# Patient Record
Sex: Female | Born: 1990 | Race: White | Hispanic: No | Marital: Single | Smoking: Never smoker
Health system: Southern US, Community
[De-identification: ages and names within clinical notes are randomized; demographics above are authoritative.]

## PROBLEM LIST (undated history)

## (undated) DIAGNOSIS — F419 Anxiety disorder, unspecified: Secondary | ICD-10-CM

## (undated) HISTORY — DX: Anxiety disorder, unspecified: F41.9

---
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
## 2011-01-28 NOTE — Assessment & Plan Note (Signed)
Summary: college cpx ok per doc/njr   Vital Signs:  Patient profile:   20 year old female Weight:      114 pounds BMI:     19.95 BP sitting:   102 / 68  (left arm) Cuff size:   regular  Vitals Entered By: Raechel Ache, RN (June 23, 2010 3:35 PM) CC: College CPX, c/o L knee pain.   History of Present Illness: 20 yr old female for a college exam and also to discuss left knee pain. She will be attending [employer] this fall, and has some forms to fill out. She feels good in general, but about 2 months ago she fell down going up some steps. She felt her left knee give way, causing her to fall. Ever since then the knee has been mildly swollen, it hurts to walk or run on it, and she cannot bend it all the way. No more giving way or locking. She saw an extender at [employer] a few weeks ago, and he ordered some PT for her. This has not helped at all. Plain Xrays were normal.   Allergies (verified): No Known Drug Allergies  Past History:  Past Medical History: migraines sees Rocco Pauls   Past Surgical History: arthroscopy right knee 05-08-2009 per Dr. Thomasena Edis for torn meniscus  Family History: Reviewed history from 08/28/2008 and no changes required. unremarkable  Social History: Reviewed history from 08/28/2008 and no changes required. Lives with parents Care taker verifies today that the child's current immunizations are up to date.   Review of Systems  The patient denies anorexia, fever, weight loss, weight gain, vision loss, decreased hearing, hoarseness, chest pain, syncope, dyspnea on exertion, peripheral edema, prolonged cough, headaches, hemoptysis, abdominal pain, melena, hematochezia, severe indigestion/heartburn, hematuria, incontinence, genital sores, muscle weakness, suspicious skin lesions, transient blindness, difficulty walking, depression, unusual weight change, abnormal bleeding, enlarged lymph nodes, angioedema, breast masses, and  testicular masses.    Physical Exam  General:  Well-developed,well-nourished,in no acute distress; alert,appropriate and cooperative throughout examination Head:  Normocephalic and atraumatic without obvious abnormalities. No apparent alopecia or balding. Eyes:  No corneal or conjunctival inflammation noted. EOMI. Perrla. Funduscopic exam benign, without hemorrhages, exudates or papilledema. Vision grossly normal. Ears:  External ear exam shows no significant lesions or deformities.  Otoscopic examination reveals clear canals, tympanic membranes are intact bilaterally without bulging, retraction, inflammation or discharge. Hearing is grossly normal bilaterally. Nose:  External nasal examination shows no deformity or inflammation. Nasal mucosa are pink and moist without lesions or exudates. Mouth:  Oral mucosa and oropharynx without lesions or exudates.  Teeth in good repair. Neck:  No deformities, masses, or tenderness noted. Chest Wall:  No deformities, masses, or tenderness noted. Lungs:  Normal respiratory effort, chest expands symmetrically. Lungs are clear to auscultation, no crackles or wheezes. Heart:  Normal rate and regular rhythm. S1 and S2 normal without gallop, murmur, click, rub or other extra sounds. Abdomen:  Bowel sounds positive,abdomen soft and non-tender without masses, organomegaly or hernias noted. Msk:  No deformity or scoliosis noted of thoracic or lumbar spine.   Pulses:  R and L carotid,radial,femoral,dorsalis pedis and posterior tibial pulses are full and equal bilaterally Extremities:  normal except the left knee. Ths is mildly swollen. She cannot fully flex or extend the knee due to pain. She is not tender at all. Anterior drawer and McMurrays are negative.  Neurologic:  No cranial nerve deficits noted. Station and gait are normal. Plantar reflexes are down-going  bilaterally. DTRs are symmetrical throughout. Sensory, motor and coordinative functions appear intact. Skin:   Intact without suspicious lesions or rashes Cervical Nodes:  No lymphadenopathy noted Axillary Nodes:  No palpable lymphadenopathy Inguinal Nodes:  No significant adenopathy Psych:  Cognition and judgment appear intact. Alert and cooperative with normal attention span and concentration. No apparent delusions, illusions, hallucinations   Impression & Recommendations:  Problem # 1:  HEALTH MAINTENANCE EXAM (ICD-V70.0)  Problem # 2:  KNEE PAIN (ICD-719.46)  Complete Medication List: 1)  Implanon 68 Mg Impl (Etonogestrel) .... Left arm, birth control 2)  Zomig Zmt 5 Mg Tbdp (Zolmitriptan) .... One by mouth at onset on migraine and may repeat once in 2 hours with max of 2 in 24 hours.  Other Orders: Tdap => 18yrs IM (21308) Menactra IM (65784) Admin 1st Vaccine (69629) Admin of Any Addtl Vaccine (52841)  Patient Instructions: 1)  She was given a Menactra and a Tdap today.  2)  Advised her to see Dr. Thomasena Edis ASAP about her left knee, as she may have torn some cartilage in it.    Immunization History:  Hepatitis B Immunization History:    Hepatitis B # 1:  hepb nb-36yrs ([birth year])    Hepatitis B # 2:  hepb nb-86yrs (06/02/1992)    Hepatitis B # 3:  hepb nb-20yrs (10/30/1992)  DPT Immunization History:    DPT # 1:  dpt (06/27/1992)    DPT # 2:  dpt (08/25/1992)    DPT # 3:  dpt (10/30/1992)    DPT # 4:  dpt (08/21/1993)  HIB Immunization History:    HIB # 1:  hib (06/27/1992)    HIB # 2:  hib (08/25/1992)    HIB # 3:  hib (10/30/1992)    HIB # 4:  hib (08/21/1993)  Polio Immunization History:    Polio # 1:  historical (06/27/1992)    Polio # 2:  historical (08/25/1992)    Polio # 3:  historical (01/23/1993)  MMR Immunization History:    MMR # 1:  mmr (08/21/1993)  Immunizations Administered:  Tetanus Vaccine:    Vaccine Type: Tdap    Site: left deltoid    Mfr: GlaxoSmithKline    Dose: 0.5 ml    Route: IM    Given by: Raechel Ache, RN    Exp. Date:  03/19/2012    Lot #: [id number]    VIS given: 11/14/07 version given June 23, 2010.  Meningococcal Vaccine:    Vaccine Type: Menactra    Site: right deltoid    Mfr: Sanofi Pasteur    Dose: 0.5 ml    Route: IM    Given by: Raechel Ache, RN    Exp. Date: 01/14/2012    Lot #: [id number]    VIS given: 01/23/07 version given June 23, 2010.

## 2011-01-28 NOTE — Progress Notes (Signed)
Summary: Pt req to get a school cpx done asap  Phone Note Call from Patient Call back at [phone] cell   Caller: Patient Summary of Call: Pt called and is needing to get a school cpx done asap. Pls advise.      Initial call taken by: Lucy Antigua,  July 16, 2010 1:58 PM  Follow-up for Phone Call        I not sure what she needs, we already did a college cpx on 06-23-10 Follow-up by: Nelwyn Salisbury MD,  July 17, 2010 10:39 AM  Additional Follow-up for Phone Call Additional follow up Details #1::        I called and lft vm for pt to return my call. Additional Follow-up by: Lucy Antigua,  July 17, 2010 12:57 PM    Additional Follow-up for Phone Call Additional follow up Details #2::    Pt is aware that she came in for cpx in June, but she said that she needs Dr. Clent Ridges to complete a physical form. Pt would like to drop form by the office, and she will pick up when completed. Due date on form in August. If pt needs an ov to have form completed, please advise.    Follow-up by: Lucy Antigua,  July 17, 2010 3:08 PM  Additional Follow-up for Phone Call Additional follow up Details #3:: Details for Additional Follow-up Action Taken: she can just drop the form off and I will fill it out for her, no OV needed Additional Follow-up by: Nelwyn Salisbury MD,  July 17, 2010 4:27 PM

## 2011-01-28 NOTE — Progress Notes (Signed)
Summary: requesting cpx asap  Phone Note Call from Patient Call back at [phone]   Caller: Mom---live call Reason for Call: Talk to Doctor Summary of Call: requesting a collge cpx with some shots before 06-26-2010. Please advise. Initial call taken by: Warnell Forester,  June 18, 2010 8:53 AM  Follow-up for Phone Call        anytime next week would be fine Follow-up by: Nelwyn Salisbury MD,  June 18, 2010 11:15 AM  Additional Follow-up for Phone Call Additional follow up Details #1::        cpx 06-23-2010 330pm Additional Follow-up by: Heron Sabins,  June 18, 2010 2:28 PM

## 2011-01-28 NOTE — Letter (Signed)
Summary: Immunization/Shot Record  [facility]  [address] [place], [place] [address]   Phone: [phone]  Fax: [phone]     Immunization Record for: Mckenzie Kelley Bos  Vaccine 1 2 3 4 5 6  HepB Hepatitis B [birth year] 1992      06/02/1992      10/30/1992          DTP Diphtheria, Tetanus, Pertussis 06/27/1992      08/25/1992      10/30/1992      08/21/1993          HIB Haemophilus influenzae Type b 06/27/1992     08/25/1992     10/30/1992     08/21/1993     ZHYQMVHQIO IPV Inactivated Poliovirus 06/27/1992    08/25/1992    01/23/1993       MMR Measles, Mumps, Rubella 08/21/1993 06/24/2010  Marguerite Olea NGEXBMWUXL KGMWNUUVOZ Varicella Varivax    DGUYQIHKVQ QVZDGLOVFI EPPIRJJOAC Pneumococcal           Hep A Hepatitis A   ZYSAYTKZSW FUXNATFTDD UKGURKYHCW CBJSEGBTDV        Tetanus Booster Date of Last: Tdap 06/23/2010  Flu Shot Date of Last:  Pneumovax Date of Last:  Meningococcal Vaccine Given: Menactra 06/23/2010     Other Vaccines HPV Vaccine/ Date of Last:    Vaccine/ Date of Last:    Vaccine/ Date of Last:     Marguerite Olea  VOHYWVPXTG  GYIRSWNIOE Rotavirus Vaccine/ Date of Last:    Vaccine/ Date of Last:    Vaccine/ Date of Last:     VOJJKKXFGH  [facility]  WEXHBZJIRC Zostavax Vaccine/ Date of Last:     Marguerite Olea  VELFYBOFBP  Marguerite Olea  ZWCHENIDPO  EUMPNTIRWE  Recommended Childhood and Adolescent Immunization Schedule [place]  2006 Vaccine Age Birth 1 mos 2 mos 4 mos 6 mos 12 mos 15 mos 18 mos 24 mos 4-6 yrs 11-12 yrs 13-14 yrs 15 yrs 16-18 yrs Hepatitis B1 HepB HepB HepB1  HepB  HepB Series Catch-Up Diphtheria, Tetanus, Pertussis2   DTaP DTaP DTaP   DTaP  DTaP Tdap  Tdap Catch-Up Haemophilus influenzae type b3   Hib Hib Hib3 Hib        Inactivated  Poliovirus   IPV IPV  IPV   IPV     Measles, Mumps, Rubella4      MMR   MMR M MR MMR Catch-Up Varicella5       Varicella  Varicella Catch-Up Meningo-coccal6           MCV4  MCV4 CatchUpV4           MPSV4 for High Risk Groups  C MCV4 for High Risk Groups Pneumo-coccal7   PCV PCV PCV PCV   PCV  Catch-Up PPV for High Risk Groups         PPV for High Risk Groups  Influenza8      Influenza (Yearly)  Influenza (Yearly) for High Risk Groups Hepatitis A9       HepA Series  This schedule indicates the recommended ages for routine administration of currently licensed childhood vaccines, as of November 26, 2004, for children through age 75 years. Any dose not administered at the recommended age should be administered at any subsequent visit when indicated and feasible. Indicates age groups that warrant special effort to administer those vaccines not previously administered. Additional vaccines may be licensed and recommended during the year. Licensed combination vaccines may be used whenever any components of the combination are indicated and other components  of the vaccine are not contraindicated and if approved by the Food and Drug Administration for that dose of the series. Providers should consult the respective ACIP statement for detailed recommendations. Clinically significant adverse events that follow immunization should be reported to the Vaccine Adverse Event Reporting System (VAERS). Guidance about how to obtain and complete a VAERS form is available at www.vaers.LAgents.no or by telephone, 4107679043.  The Childhood and Adolescent Immunization Schedule is approved by: Advisory Committee on Administrator http://www.wade.com/   American Academy of Pediatrics BridgeDigest.com.cy   American Academy [employer] American.aafp.org

## 2011-01-28 NOTE — Letter (Signed)
Summary: Physical Examination for College  Physical Examination for College   Imported By: Maryln Gottron 07/27/2010 11:09:10  _____________________________________________________________________  External Attachment:    Type:   Image     Comment:   External Document

## 2011-01-28 NOTE — Assessment & Plan Note (Signed)
Summary: MMR  Nurse Visit   Allergies: No Known Drug Allergies  Immunizations Administered:  MMR Vaccine # 2:    Vaccine Type: MMR    Site: right deltoid    Mfr: Merck    Dose: 0.5 ml    Given by: Raechel Ache, RN    Exp. Date: 09/24/2011    Lot #: [id number]    VIS given: 03/09/07 version given June 24, 2010.  Orders Added: 1)  MMR Vaccine SQ [90707] 2)  Admin 1st Vaccine [57846]

## 2011-01-28 NOTE — Assessment & Plan Note (Signed)
Summary: H/A, EPISTAXIS // RS   Vital Signs:  Patient profile:   20 year old female Temp:     98 degrees F oral BP sitting:   130 / 72  (left arm) Cuff size:   regular  Vitals Entered By: Sid Falcon LPN (June 01, 1609 2:34 PM) CC: headaches, epistaxis   History of Present Illness: Headaches, recently progressive in frequency.  3- 4 year hx of headaches.  Throbbing headache bifrontal and sometimes unilateral.  Photophobia and worse with activity.  Some nausea without vomiting.  Helps to get in dark room and sleep. Ibuprofen helps sometimes but not recently.  Headaches are moderate to severe at times.  No focal neurologic symptoms.  Duration usually several hours.  Fa hx of migraine headaches.  One episode of nose bleed.   No aura.  No clear triggers.  Headaches do not correlate with menses.  Moderate caffeine consumption.  Physical Exam  General:  well developed, well nourished, in no acute distress Head:  normocephalic and atraumatic Eyes:  PERRLA/EOM intact; symetric corneal light reflex and red reflex; normal cover-uncover test Ears:  TMs intact and clear with normal canals and hearing Mouth:  no deformity or lesions and dentition appropriate for age Neck:  no masses, thyromegaly, or abnormal cervical nodes Lungs:  clear bilaterally to A & P Heart:  RRR without murmur Neurologic:  no focal deficits, CN II-XII grossly intact with normal reflexes, coordination, muscle strength and tone Skin:  no rash. Cervical Nodes:  no significant adenopathy   Allergies (verified): No Known Drug Allergies  Past History:  Past Medical History: Last updated: 08/28/2008 Unremarkable  Review of Systems  The patient denies anorexia, fever, weight loss, vision loss, decreased hearing, syncope, difficulty walking, and depression.     Impression & Recommendations:  Problem # 1:  MIGRAINE HEADACHE (ICD-346.90) Assessment New  Clinical features which suggest migraine include  unilateral (sometimes), photophobia, throbbing quality, nausea, and improvement with sleep.  Discussed possible triggers.  Sample of Zomig ZMT given 5 mg with instruction for use and rx if this helps.  F/U with Dr Clent Ridges if headaches persist. Her updated medication list for this problem includes:    Zomig Zmt 5 Mg Tbdp (Zolmitriptan) ..... One by mouth at onset on migraine and may repeat once in 2 hours with max of 2 in 24 hours.  Orders: Est. Patient Level IV (96045)  Medications Added to Medication List This Visit: 1)  Implanon 68 Mg Impl (Etonogestrel) .... Left arm, birth control 2)  Zomig Zmt 5 Mg Tbdp (Zolmitriptan) .... One by mouth at onset on migraine and may repeat once in 2 hours with max of 2 in 24 hours.  Patient Instructions: 1)  With next headache try Zomig one at onset and may repeat once in 2 hours as needed. Prescriptions: ZOMIG ZMT 5 MG TBDP (ZOLMITRIPTAN) one by mouth at onset on migraine and may repeat once in 2 hours with max of 2 in 24 hours.  #6 x 6   Entered and Authorized by:   Evelena Peat MD   Signed by:   Evelena Peat MD on 06/01/2010   Method used:   Print then Give to Patient   RxID:   [id number]

## 2011-03-30 LAB — COMPREHENSIVE METABOLIC PANEL
ALT: 10 U/L (ref 0–35)
AST: 19 U/L (ref 0–37)
Albumin: 4 g/dL (ref 3.5–5.2)
Alkaline Phosphatase: 71 U/L (ref 47–119)
BUN: 9 mg/dL (ref 6–23)
CO2: 24 mEq/L (ref 19–32)
Calcium: 9.3 mg/dL (ref 8.4–10.5)
Chloride: 107 mEq/L (ref 96–112)
Creatinine, Ser: 0.82 mg/dL (ref 0.4–1.2)
Glucose, Bld: 102 mg/dL — ABNORMAL HIGH (ref 70–99)
Potassium: 3.8 mEq/L (ref 3.5–5.1)
Sodium: 139 mEq/L (ref 135–145)
Total Bilirubin: 0.5 mg/dL (ref 0.3–1.2)
Total Protein: 6.7 g/dL (ref 6.0–8.3)

## 2011-03-30 LAB — URINALYSIS, ROUTINE W REFLEX MICROSCOPIC
Bilirubin Urine: NEGATIVE
Glucose, UA: NEGATIVE mg/dL
Hgb urine dipstick: NEGATIVE
Ketones, ur: NEGATIVE mg/dL
Nitrite: NEGATIVE
Protein, ur: NEGATIVE mg/dL
Specific Gravity, Urine: 1.01 (ref 1.005–1.030)
Urobilinogen, UA: 1 mg/dL (ref 0.0–1.0)
pH: 7 (ref 5.0–8.0)

## 2011-03-30 LAB — DIFFERENTIAL
Basophils Absolute: 0.1 10*3/uL (ref 0.0–0.1)
Basophils Relative: 1 % (ref 0–1)
Lymphocytes Relative: 45 % (ref 24–48)
Neutro Abs: 3.3 10*3/uL (ref 1.7–8.0)

## 2011-03-30 LAB — CBC
HCT: 36 % (ref 36.0–49.0)
Hemoglobin: 12.3 g/dL (ref 12.0–16.0)
MCHC: 34.3 g/dL (ref 31.0–37.0)
MCV: 88.1 fL (ref 78.0–98.0)
Platelets: 260 10*3/uL (ref 150–400)
RBC: 4.08 MIL/uL (ref 3.80–5.70)
RDW: 12.5 % (ref 11.4–15.5)
WBC: 7.3 10*3/uL (ref 4.5–13.5)

## 2011-03-30 LAB — LIPASE, BLOOD: Lipase: 25 U/L (ref 11–59)

## 2011-03-30 LAB — POCT PREGNANCY, URINE: Preg Test, Ur: NEGATIVE

## 2011-07-19 ENCOUNTER — Ambulatory Visit: Payer: Self-pay | Admitting: Family Medicine

## 2011-07-19 DIAGNOSIS — Z0000 Encounter for general adult medical examination without abnormal findings: Secondary | ICD-10-CM

## 2011-07-19 DIAGNOSIS — Z111 Encounter for screening for respiratory tuberculosis: Secondary | ICD-10-CM

## 2011-07-22 LAB — TB SKIN TEST: Induration: 0

## 2011-09-22 LAB — POCT URINALYSIS DIP (DEVICE)
Bilirubin Urine: NEGATIVE
Nitrite: NEGATIVE
Specific Gravity, Urine: <=1.005
Urobilinogen, UA: 0.2
pH: 7

## 2011-09-22 LAB — POCT PREGNANCY, URINE: Preg Test, Ur: NEGATIVE

## 2011-12-06 ENCOUNTER — Ambulatory Visit (INDEPENDENT_AMBULATORY_CARE_PROVIDER_SITE_OTHER): Payer: Self-pay | Admitting: Family Medicine

## 2011-12-06 ENCOUNTER — Encounter: Payer: Self-pay | Admitting: Family Medicine

## 2011-12-06 VITALS — BP 108/60 | HR 88 | Temp 98.1°F | Wt 127.0 lb

## 2011-12-06 DIAGNOSIS — F411 Generalized anxiety disorder: Secondary | ICD-10-CM

## 2011-12-06 DIAGNOSIS — F419 Anxiety disorder, unspecified: Secondary | ICD-10-CM

## 2011-12-06 MED ORDER — CITALOPRAM HYDROBROMIDE 10 MG PO TABS: 10.0000 mg | ORAL_TABLET | Freq: Every day | ORAL | Status: DC

## 2011-12-06 NOTE — Progress Notes (Signed)
  Subjective:    Patient ID: Mckenzie Kelley, female    DOB: Jul 18, 1991, 20 y.o.   MRN: [id number]  HPI Here to discuss some anxiety issues she has been dealing with lately. She describes herself as having been anxious all her life and as being a perfectionist. When it comes to school she always worries about things that she has to do in the future and about things she has already done. She often cannot sleep but worries about things all night. 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 She denies any depression.    Review of Systems  Constitutional: Negative.   Psychiatric/Behavioral: Negative for behavioral problems, confusion, dysphoric mood, decreased concentration and agitation. The patient is nervous/anxious.        Objective:   Physical Exam  Constitutional: She appears well-developed and well-nourished.  Psychiatric: She has a normal mood and affect. Her behavior is normal. Judgment and thought content normal.          Assessment & Plan:  Mild anxiety with some obsessive compulsive qualities. Try celexa. Recheck in 2-3 weeks

## 2011-12-31 ENCOUNTER — Ambulatory Visit (INDEPENDENT_AMBULATORY_CARE_PROVIDER_SITE_OTHER): Payer: Self-pay | Admitting: Family Medicine

## 2011-12-31 ENCOUNTER — Encounter: Payer: Self-pay | Admitting: Family Medicine

## 2011-12-31 VITALS — BP 100/68 | HR 73 | Temp 98.1°F | Wt 125.0 lb

## 2011-12-31 DIAGNOSIS — M26609 Unspecified temporomandibular joint disorder, unspecified side: Secondary | ICD-10-CM

## 2011-12-31 DIAGNOSIS — F411 Generalized anxiety disorder: Secondary | ICD-10-CM

## 2011-12-31 DIAGNOSIS — F419 Anxiety disorder, unspecified: Secondary | ICD-10-CM

## 2011-12-31 MED ORDER — CITALOPRAM HYDROBROMIDE 20 MG PO TABS: 20.0000 mg | ORAL_TABLET | Freq: Every day | ORAL | Status: DC

## 2011-12-31 NOTE — Progress Notes (Signed)
  Subjective:    Patient ID: Mckenzie Kelley, female    DOB: January 25, 1991, 21 y.o.   MRN: [id number]  HPI Here to recheck on her anxiety after taking Celexa for a few weeks. She has had no side effects but she has not noticed much of a difference either way. She has been on break from classes so her stress levels have been lower then usual anyway. Also she describes intermittent pains in the right jaw for the past few months. Sometimes her jaw pops and it is hard to open her mouth or chew. Advil helps. Today it feels better. She admits to clenching her mouth a lot when stressed.    Review of Systems  Constitutional: Negative.   HENT: Negative.   Psychiatric/Behavioral: Negative for dysphoric mood, decreased concentration and agitation. The patient is nervous/anxious.        Objective:   Physical Exam  Constitutional: She appears well-developed and well-nourished.  HENT:       The right TMJ is tender but there is no crepitus. Full ROM   Psychiatric: She has a normal mood and affect. Her behavior is normal. Thought content normal.          Assessment & Plan:  We will increase the Celexa to 20 mg a day. 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 Use 800 mg of Advil prn. She will see her dentist to get a night time mouth guard made.

## 2012-02-29 ENCOUNTER — Telehealth: Payer: Self-pay | Admitting: *Deleted

## 2012-02-29 NOTE — Telephone Encounter (Signed)
Both children have been exposed to vomiting, diarrhea, low grade fever and would like to have RX called to [facility].   All the children had been exposed to Dr. Claris Che children.

## 2012-03-01 NOTE — Telephone Encounter (Signed)
This message was in error. We spoke to [place] and she is not actually sick. She feels fine. Her sister, Adelina Mings, was sick.

## 2012-07-26 ENCOUNTER — Ambulatory Visit (INDEPENDENT_AMBULATORY_CARE_PROVIDER_SITE_OTHER): Payer: 59 | Admitting: Family Medicine

## 2012-07-26 ENCOUNTER — Encounter: Payer: Self-pay | Admitting: Family Medicine

## 2012-07-26 VITALS — BP 100/64 | HR 60 | Temp 98.5°F | Wt 124.0 lb

## 2012-07-26 DIAGNOSIS — H6090 Unspecified otitis externa, unspecified ear: Secondary | ICD-10-CM

## 2012-07-26 DIAGNOSIS — H60399 Other infective otitis externa, unspecified ear: Secondary | ICD-10-CM

## 2012-07-26 MED ORDER — NEOMYCIN-POLYMYXIN-HC 3.5-10000-1 OT SOLN: 3.0000 [drp] | Freq: Four times a day (QID) | OTIC | Status: AC

## 2012-07-26 NOTE — Progress Notes (Signed)
  Subjective:    Patient ID: Mckenzie Kelley, female    DOB: Jan 30, 1991, 21 y.o.   MRN: [id number]  HPI Here for 4 days of right ear pain. No other symptoms. She spent a week at the beach last week.    Review of Systems  Constitutional: Negative.   HENT: Positive for ear pain. Negative for hearing loss, congestion, postnasal drip, sinus pressure and ear discharge.   Eyes: Negative.   Respiratory: Negative.        Objective:   Physical Exam  Constitutional: She appears well-developed and well-nourished.  HENT:  Left Ear: External ear normal.  Nose: Nose normal.  Mouth/Throat: Oropharynx is clear and moist.       Right ear canal is red and swollen, the TM is clear   Eyes: Conjunctivae are normal.  Lymphadenopathy:    She has no cervical adenopathy.          Assessment & Plan:  Use Advil prn. Keep water out of the ear until this has resolved

## 2012-12-11 ENCOUNTER — Ambulatory Visit (INDEPENDENT_AMBULATORY_CARE_PROVIDER_SITE_OTHER): Payer: 59 | Admitting: Family Medicine

## 2012-12-11 ENCOUNTER — Encounter: Payer: Self-pay | Admitting: Family Medicine

## 2012-12-11 VITALS — BP 108/70 | HR 64 | Temp 98.0°F | Wt 126.0 lb

## 2012-12-11 DIAGNOSIS — B081 Molluscum contagiosum: Secondary | ICD-10-CM

## 2012-12-11 NOTE — Progress Notes (Signed)
  Subjective:    Patient ID: Mckenzie Kelley, female    DOB: 04/21/1991, 21 y.o.   MRN: [id number]  HPI Here while on her winter break from college to look an itchy rash that started on the right neck about one month ago. It has since spread to the upper chest and the back. She saw [facility] 2 weeks ago and was given a course of Doxycycline. This has not helped. She has no other symptoms.    Review of Systems  Constitutional: Negative.   Skin: Positive for rash.       Objective:   Physical Exam  Constitutional: She appears well-developed and well-nourished. No distress.  Skin:       Wide spread rash consisting of individual tiny papules on the chest and back. Some are umbilicated, many are not. No erythema, no pustules or vesicles           Assessment & Plan:  Molluscum contagiosum. We will refer her to Dermatology. She will avoid any direct skin contact with others.

## 2013-03-14 ENCOUNTER — Encounter: Payer: Self-pay | Admitting: Family Medicine

## 2013-03-14 ENCOUNTER — Ambulatory Visit (INDEPENDENT_AMBULATORY_CARE_PROVIDER_SITE_OTHER): Payer: BC Managed Care – PPO | Admitting: Family Medicine

## 2013-03-14 VITALS — BP 110/64 | HR 65 | Temp 98.6°F | Wt 130.0 lb

## 2013-03-14 DIAGNOSIS — F411 Generalized anxiety disorder: Secondary | ICD-10-CM

## 2013-03-14 MED ORDER — FLUOXETINE HCL 20 MG PO TABS: 20.0000 mg | ORAL_TABLET | Freq: Every day | ORAL | Status: DC

## 2013-03-14 NOTE — Progress Notes (Signed)
  Subjective:    Patient ID: Mckenzie Kelley, female    DOB: 09-Mar-1991, 21 y.o.   MRN: [id number]  HPI Here for her anxiety. 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 However her stress levels are high and and she feels very anxious. She has had a couple of episodes of over whelming anxiety which sound almost like panic attacks. She has been seeing a therapist weekly and this helps. She had taken Celexa for a while until she stopped thi last year since it did not seem to help her. She sleeps well.    Review of Systems  Constitutional: Negative.   Psychiatric/Behavioral: Positive for decreased concentration. Negative for hallucinations, confusion, dysphoric mood and agitation. The patient is nervous/anxious.        Objective:   Physical Exam  Constitutional: She is oriented to person, place, and time. She appears well-developed and well-nourished.  Neurological: She is alert and oriented to person, place, and time.  Psychiatric: She has a normal mood and affect. Her behavior is normal. Thought content normal.          Assessment & Plan:  We will start on Prozac at 20 mg daily. Recheck in 2-3 weeks

## 2014-07-30 ENCOUNTER — Ambulatory Visit (INDEPENDENT_AMBULATORY_CARE_PROVIDER_SITE_OTHER): Payer: BC Managed Care – PPO | Admitting: Family Medicine

## 2014-07-30 DIAGNOSIS — Z111 Encounter for screening for respiratory tuberculosis: Secondary | ICD-10-CM

## 2014-08-02 LAB — TB SKIN TEST
INDURATION: 0 mm
TB SKIN TEST: NEGATIVE

## 2014-08-12 ENCOUNTER — Ambulatory Visit (INDEPENDENT_AMBULATORY_CARE_PROVIDER_SITE_OTHER): Payer: BC Managed Care – PPO | Admitting: Physician Assistant

## 2014-08-12 ENCOUNTER — Encounter: Payer: Self-pay | Admitting: Physician Assistant

## 2014-08-12 ENCOUNTER — Ambulatory Visit: Payer: BC Managed Care – PPO | Admitting: Physician Assistant

## 2014-08-12 VITALS — BP 108/68 | HR 66 | Temp 98.3°F | Resp 18 | Ht 63.0 in | Wt 128.0 lb

## 2014-08-12 DIAGNOSIS — L259 Unspecified contact dermatitis, unspecified cause: Secondary | ICD-10-CM

## 2014-08-12 DIAGNOSIS — Z0000 Encounter for general adult medical examination without abnormal findings: Secondary | ICD-10-CM

## 2014-08-12 LAB — CBC WITH DIFFERENTIAL/PLATELET
BASOS ABS: 0 10*3/uL (ref 0.0–0.1)
Basophils Relative: 0.4 % (ref 0.0–3.0)
EOS ABS: 0.1 10*3/uL (ref 0.0–0.7)
Eosinophils Relative: 1.1 % (ref 0.0–5.0)
HCT: 37.8 % (ref 36.0–46.0)
Hemoglobin: 13.1 g/dL (ref 12.0–15.0)
LYMPHS PCT: 29.5 % (ref 12.0–46.0)
Lymphs Abs: 2.1 10*3/uL (ref 0.7–4.0)
MCHC: 34.5 g/dL (ref 30.0–36.0)
MCV: 89 fl (ref 78.0–100.0)
MONOS PCT: 7.6 % (ref 3.0–12.0)
Monocytes Absolute: 0.5 10*3/uL (ref 0.1–1.0)
NEUTROS PCT: 61.4 % (ref 43.0–77.0)
Neutro Abs: 4.4 10*3/uL (ref 1.4–7.7)
Platelets: 265 10*3/uL (ref 150.0–400.0)
RBC: 4.25 Mil/uL (ref 3.87–5.11)
RDW: 12.9 % (ref 11.5–15.5)
WBC: 7.1 10*3/uL (ref 4.0–10.5)

## 2014-08-12 LAB — BASIC METABOLIC PANEL
BUN: 11 mg/dL (ref 6–23)
CHLORIDE: 107 meq/L (ref 96–112)
CO2: 27 mEq/L (ref 19–32)
Calcium: 9.3 mg/dL (ref 8.4–10.5)
Creatinine, Ser: 0.9 mg/dL (ref 0.4–1.2)
GFR: 88.66 mL/min (ref >60.00)
Glucose, Bld: 73 mg/dL (ref 70–99)
POTASSIUM: 4 meq/L (ref 3.5–5.1)
Sodium: 143 mEq/L (ref 135–145)

## 2014-08-12 LAB — POCT URINALYSIS DIPSTICK
Bilirubin, UA: NEGATIVE
Glucose, UA: NEGATIVE
Ketones, UA: NEGATIVE
LEUKOCYTES UA: NEGATIVE
Nitrite, UA: NEGATIVE
PROTEIN UA: NEGATIVE
SPEC GRAV UA: 1.02
UROBILINOGEN UA: 0.2
pH, UA: 5.5

## 2014-08-12 LAB — HEPATIC FUNCTION PANEL
ALBUMIN: 4.1 g/dL (ref 3.5–5.2)
ALT: 12 U/L (ref 0–35)
AST: 19 U/L (ref 0–37)
Alkaline Phosphatase: 67 U/L (ref 39–117)
Bilirubin, Direct: 0.1 mg/dL (ref 0.0–0.3)
TOTAL PROTEIN: 7.2 g/dL (ref 6.0–8.3)
Total Bilirubin: 0.5 mg/dL (ref 0.2–1.2)

## 2014-08-12 LAB — LIPID PANEL
CHOLESTEROL: 138 mg/dL (ref 0–200)
HDL: 54.2 mg/dL (ref >39.00)
LDL CALC: 62 mg/dL (ref 0–99)
NonHDL: 83.8
TRIGLYCERIDES: 107 mg/dL (ref 0.0–149.0)
Total CHOL/HDL Ratio: 3
VLDL: 21.4 mg/dL (ref 0.0–40.0)

## 2014-08-12 NOTE — Progress Notes (Signed)
Subjective:    Patient ID: Mckenzie Kelley, female    DOB: Jun 10, 1991, 23 y.o.   MRN: [id number]  HPI Patient presents to clinic today for annual exam.  Acute Concerns: Rash- noticed yesterday. Doesn't appear to be improving or worsening. On both forearms. There is small red bumps. Very itchy. No swelling or ttp. Patient believes that she may have gotten this from a new detergent that she started. She has not tried anything for this. She denies fevers, chills, nausea, vomiting, diarrhea, shortness of breath, chest pain, headache, syncope.  Chronic Issues: None.  Health Maintenance: Dental -- Dr. Dian Kelley, once or twice per year. Vision -- None visual concerns. Immunizations -- UTD. Colonoscopy -- No family history of colon cancer. PAP -- Within the last year. LMP- currently on period.     Review of Systems Patient denies chest pain, shortness of breath, orthopnea. Denies lower extremity edema, abdominal pain, change in appetite, change in bowel movements. Patient denies rashes, musculoskeletal complaints. No other specific complaints in a complete review of systems.     Past Medical History  Diagnosis Date  . Migraine   . Anxiety     History   Social History  . Marital Status: Single    Spouse Name: N/A    Number of Children: N/A  . Years of Education: N/A   Occupational History  . Not on file.   Social History Main Topics  . Smoking status: Never Smoker   . Smokeless tobacco: Never Used  . Alcohol Use: No  . Drug Use: No  . Sexual Activity: Not on file   Other Topics Concern  . Not on file   Social History Narrative  . No narrative on file    Past Surgical History  Procedure Laterality Date  . Knee arthroscopy  05/08/09    Dr. Thomasena Edis for torn meniscus    No family history on file.  No Known Allergies  Current Outpatient Prescriptions on File Prior to Visit  Medication Sig Dispense Refill  . FLUoxetine (PROZAC) 20 MG tablet Take 1 tablet (20 mg  total) by mouth daily.  90 tablet  3   No current facility-administered medications on file prior to visit.    The PFS history was reviewed with the pt at time of visit.  EXAM: BP 108/68  Pulse 66  Temp(Src) 98.3 F (36.8 C) (Oral)  Resp 18  Ht 5\' 3"  (1.6 m)  Wt 128 lb (58.06 kg)  BMI 22.68 kg/m2  LMP 08/06/2014     Objective:   Physical Exam  Nursing note and vitals reviewed. Constitutional: She is oriented to person, place, and time. She appears well-developed and well-nourished. No distress.  HENT:  Head: Normocephalic and atraumatic.  Right Ear: External ear normal.  Left Ear: External ear normal.  Nose: Nose normal.  Mouth/Throat: Oropharynx is clear and moist. No oropharyngeal exudate.  Bilat TMs normal.  Eyes: Conjunctivae and EOM are normal. Pupils are equal, round, and reactive to light. Right eye exhibits no discharge. Left eye exhibits no discharge. No scleral icterus.  Neck: Normal range of motion. Neck supple. No JVD present. No tracheal deviation present. No thyromegaly present.  Cardiovascular: Normal rate, regular rhythm, normal heart sounds and intact distal pulses.  Exam reveals no gallop and no friction rub.   No murmur heard. Pulmonary/Chest: Effort normal and breath sounds normal. No stridor. No respiratory distress. She has no wheezes. She has no rales. She exhibits no tenderness.  Abdominal: Soft. Bowel sounds  are normal. She exhibits no distension and no mass. There is no tenderness. There is no rebound and no guarding.  Genitourinary:  Breast and GYN - deferred to OBGYN.  Musculoskeletal: Normal range of motion. She exhibits no edema and no tenderness.  Lymphadenopathy:    She has no cervical adenopathy.  Neurological: She is alert and oriented to person, place, and time. She has normal reflexes. No cranial nerve deficit. She exhibits normal muscle tone. Coordination normal.  Skin: Skin is warm and dry. Rash noted. She is not diaphoretic. No  erythema. No pallor.  Limited area of papular rash on bilateral forearms proximal to the elbows. This is non-erythematous, no swelling, no tenderness to palpation, no fluctuance.  Psychiatric: She has a normal mood and affect. Her behavior is normal. Judgment and thought content normal.     Lab Results  Component Value Date   WBC 7.3 12/02/2009   HGB 12.3 12/02/2009   HCT 36.0 12/02/2009   PLT 260 12/02/2009   GLUCOSE 102* 12/02/2009   ALT 10 12/02/2009   AST 19 12/02/2009   NA 139 12/02/2009   K 3.8 12/02/2009   CL 107 12/02/2009   CREATININE 0.82 12/02/2009   BUN 9 12/02/2009   CO2 24 12/02/2009        Assessment & Plan:  Mckenzie Kelley was seen today for no specified reason.  Diagnoses and associated orders for this visit:  Annual physical exam Comments: Over all healthy. Focus on healthy, varied diet, and regular exercise. - CBC with Differential - Basic Metabolic Panel - Lipid Panel - Hepatic Function Panel - POC Urinalysis Dipstick  Contact dermatitis Comments: Eliminate the cause from environment. Use topical hydrocortisone cream and cool compresses for treatment. Watchful waiting.    Return precautions provided, and patient handout on contact dermatitis.  Plan to follow up as needed, or for worsening or persistent symptoms despite treatment.  Patient Instructions  Try to find the causative agent for your rash and eliminate it from your environment, otherwise it will come back.  You can use topical hydrocortisone cream over the rash to help reduce itching symptoms. You can also use a cold compress for the itching.  Healthy varied diet, and regular exercise.  Keep your follow up with OBGYN.  If emergency symptoms discussed during visit developed, seek medical attention immediately.  Followup as needed, or for worsening or persistent symptoms despite treatment.

## 2014-08-12 NOTE — Progress Notes (Signed)
Pre visit review using our clinic review tool, if applicable. No additional management support is needed unless otherwise documented below in the visit note. 

## 2014-08-12 NOTE — Patient Instructions (Addendum)
Try to find the causative agent for your rash and eliminate it from your environment, otherwise it will come back.  You can use topical hydrocortisone cream over the rash to help reduce itching symptoms. You can also use a cold compress for the itching.  Healthy varied diet, and regular exercise.  Keep your follow up with OBGYN.  If emergency symptoms discussed during visit developed, seek medical attention immediately.  Followup as needed, or for worsening or persistent symptoms despite treatment.     Contact Dermatitis Contact dermatitis is a rash that happens when something touches the skin. You touched something that irritates your skin, or you have allergies to something you touched. HOME CARE   Avoid the thing that caused your rash.  Keep your rash away from hot water, soap, sunlight, chemicals, and other things that might bother it.  Do not scratch your rash.  You can take cool baths to help stop itching.  Only take medicine as told by your doctor.  Keep all doctor visits as told. GET HELP RIGHT AWAY IF:   Your rash is not better after 3 days.  Your rash gets worse.  Your rash is puffy (swollen), tender, red, sore, or warm.  You have problems with your medicine. MAKE SURE YOU:   Understand these instructions.  Will watch your condition.  Will get help right away if you are not doing well or get worse. Document Released: 10/10/2009 Document Revised: 03/06/2012 Document Reviewed: 05/18/2011 [facility] Patient Information 2015 [employer] [place]. This information is not intended to replace advice given to you by your health care provider. Make sure you discuss any questions you have with your health care provider.

## 2014-11-25 ENCOUNTER — Telehealth: Payer: Self-pay | Admitting: Family Medicine

## 2014-11-25 NOTE — Telephone Encounter (Signed)
noted 

## 2014-11-25 NOTE — Telephone Encounter (Signed)
Patient Information:  Caller Name: [name]  Phone: [phone]  Patient: Mckenzie Kelley, Mckenzie Kelley  Gender: Female  DOB: 07/07/1991  Age: 23 Years  PCP: Gershon CraneFry, [facility])  Pregnant: No  Office Follow Up:  Does the office need to follow up with this patient?: No  Instructions For The Office: N/A  RN Note:  Patient calling regarding mild pimple like fine rash noted on sides of face, neck and chest.  Denies blisters, pain or itching.  In addition she has had 2 menses this month.  LMP 11/10/14 normal cycle at normal time, then again she started on 11/22/14.  Describes this menses as mild and painless.  Currently not sexually active, and states "There is just no way I could be pregnant."  Encouraged to monitor menses and notify office in increase in bleeding.  Keep a chart of cycles and call office if cycles become irregular. Patient agrees with plan.  Symptoms  Reason For Call & Symptoms: rash on sides of face, neck, and chest  Reviewed Health History In EMR: Yes  Reviewed Medications In EMR: Yes  Reviewed Allergies In EMR: Yes  Reviewed Surgeries / Procedures: Yes  Date of Onset of Symptoms: 11/23/2014 OB / GYN:  LMP: 11/22/2014  Guideline(s) Used:  Rash or Redness - Localized  Disposition Per Guideline:   Home Care  Reason For Disposition Reached:   Mild localized rash  Advice Given:  Avoid the Cause:   Try to find the cause. Consider irritants like a plant (e.g., poison ivy or evergreens), chemicals (e.g., solvents or insecticides), fiberglass, a new cosmetic, or new jewelry (called contact dermatitis). A pet may be carrying the irritating substance (e.g., with poison ivy or poison oak).  Avoid Soap:  Wash the area once thoroughly with soap to remove any remaining irritants. Thereafter avoid soaps to this area. Cleanse the area when needed with warm water.  Hydrocortisone Cream for Itching:   Keep the cream in the refrigerator (Reason: it feels better if applied cold).  Available over-the-counter in [place] as 0.5% and 1% cream.  Call Back If:  Rash spreads or becomes worse  Rash lasts longer than 1 week  You become worse.  Expected Course:  Most of these rashes pass in 2 to 3 days.  Patient Will Follow Care Advice:  YES

## 2015-06-04 ENCOUNTER — Ambulatory Visit (INDEPENDENT_AMBULATORY_CARE_PROVIDER_SITE_OTHER): Payer: 59 | Admitting: Family Medicine

## 2015-06-04 ENCOUNTER — Encounter: Payer: Self-pay | Admitting: Family Medicine

## 2015-06-04 VITALS — BP 100/60 | HR 63 | Temp 97.9°F | Wt 131.0 lb

## 2015-06-04 DIAGNOSIS — F411 Generalized anxiety disorder: Secondary | ICD-10-CM | POA: Diagnosis not present

## 2015-06-04 MED ORDER — ESCITALOPRAM OXALATE 10 MG PO TABS: 10.0000 mg | ORAL_TABLET | Freq: Every day | ORAL | Status: DC

## 2015-06-04 NOTE — Progress Notes (Signed)
Pre visit review using our clinic review tool, if applicable. No additional management support is needed unless otherwise documented below in the visit note. 

## 2015-06-04 NOTE — Progress Notes (Signed)
   Subjective:    Patient ID: Mckenzie Kelley, female    DOB: 11/14/1991, 24 y.o.   MRN: [id number]  HPI Here to discuss anxiety. 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 She will be taking some pre-requisite courses so she can eventually apply to PA school. She has trouble relaxing, she worries about things, and she has a short temper. She denies any sadness or depression. She sleeps well. She tried Celexa briefly a few years ago but did not really give it a good try.    Review of Systems  Constitutional: Negative.   Neurological: Negative.   Psychiatric/Behavioral: Negative for suicidal ideas, hallucinations, behavioral problems, confusion, sleep disturbance, self-injury, dysphoric mood, decreased concentration and agitation. The patient is nervous/anxious. The patient is not hyperactive.        Objective:   Physical Exam  Constitutional: She is oriented to person, place, and time. She appears well-developed and well-nourished.  Neurological: She is alert and oriented to person, place, and time.  Psychiatric: She has a normal mood and affect. Her behavior is normal. Thought content normal.          Assessment & Plan:  Anxiety, treat with Lexapro 10 mg daily. Get regular exercise. She will follow up in one month. We spent 45 minutes face to face discussing these issues.

## 2015-06-27 ENCOUNTER — Telehealth: Payer: Self-pay | Admitting: Family Medicine

## 2015-06-27 NOTE — Telephone Encounter (Signed)
Try taking 1/2 of a tablet daily and take this at bedtime

## 2015-06-27 NOTE — Telephone Encounter (Signed)
Patient called stating escitalopram (LEXAPRO) 10 MG tablet is causing nausea. She would like to know what to do?

## 2015-06-27 NOTE — Telephone Encounter (Signed)
I spoke with pt  

## 2015-11-27 ENCOUNTER — Encounter: Payer: Self-pay | Admitting: Family Medicine

## 2015-11-27 ENCOUNTER — Ambulatory Visit (INDEPENDENT_AMBULATORY_CARE_PROVIDER_SITE_OTHER): Payer: Commercial Managed Care - PPO | Admitting: Family Medicine

## 2015-11-27 VITALS — BP 100/74 | HR 79 | Temp 98.4°F | Ht 63.0 in | Wt 129.0 lb

## 2015-11-27 DIAGNOSIS — J069 Acute upper respiratory infection, unspecified: Secondary | ICD-10-CM | POA: Diagnosis not present

## 2015-11-27 NOTE — Progress Notes (Signed)
   Subjective:    Patient ID: Mckenzie Kelley, female    DOB: 01/26/1991, 24 y.o.   MRN: [id number]  HPI Here for 3 days of ST, PND, and a dry cough. No NVD. She is taking Mucinex and drinking fluids.    Review of Systems  Constitutional: Negative.   HENT: Positive for congestion, postnasal drip and sore throat. Negative for ear pain and sinus pressure.   Eyes: Negative.   Respiratory: Positive for cough.        Objective:   Physical Exam  Constitutional: She appears well-developed and well-nourished. No distress.  HENT:  Right Ear: External ear normal.  Left Ear: External ear normal.  Nose: Nose normal.  Mouth/Throat: Oropharynx is clear and moist.  Eyes: Conjunctivae are normal.  Neck: No thyromegaly present.  Pulmonary/Chest: Effort normal and breath sounds normal.  Lymphadenopathy:    She has no cervical adenopathy.          Assessment & Plan:  Viral URI. Rest, take Advil prn. Written out of work today and tomorrow.

## 2015-11-27 NOTE — Progress Notes (Signed)
Pre visit review using our clinic review tool, if applicable. No additional management support is needed unless otherwise documented below in the visit note. 

## 2017-01-03 ENCOUNTER — Ambulatory Visit: Payer: Commercial Managed Care - PPO | Admitting: Family Medicine

## 2017-01-07 ENCOUNTER — Ambulatory Visit (INDEPENDENT_AMBULATORY_CARE_PROVIDER_SITE_OTHER): Payer: Commercial Managed Care - PPO | Admitting: Family Medicine

## 2017-01-07 ENCOUNTER — Encounter: Payer: Self-pay | Admitting: Family Medicine

## 2017-01-07 VITALS — BP 127/84 | HR 63 | Temp 98.0°F | Ht 63.0 in | Wt 126.0 lb

## 2017-01-07 DIAGNOSIS — F411 Generalized anxiety disorder: Secondary | ICD-10-CM

## 2017-01-07 MED ORDER — SERTRALINE HCL 25 MG PO TABS: 25.0000 mg | ORAL_TABLET | Freq: Every day | ORAL | 2 refills | Status: DC

## 2017-01-07 NOTE — Progress Notes (Signed)
Pre visit review using our clinic review tool, if applicable. No additional management support is needed unless otherwise documented below in the visit note. 

## 2017-01-07 NOTE — Progress Notes (Signed)
   Subjective:    Patient ID: Mckenzie Kelley, female    DOB: 12/23/1991, 26 y.o.   MRN: [id number]  HPI Here to ask about treatment for anxiety. She took Lexapro 5 mg 2 years ago and had mild results but the full 10 mg dose caused nausea. She stopped it and had done well for a year. Then 2 months ago her mother and sister had serious medical problems, and her anxiety came back. Now her relatives are doing better but she still feels anxious and she feels her jaws clenching a lot during the day. It is hard to relax and she has trouble sleeping at night.    Review of Systems  Constitutional: Negative.   Respiratory: Negative.   Cardiovascular: Negative.   Neurological: Negative.   Psychiatric/Behavioral: Positive for sleep disturbance. Negative for agitation, behavioral problems, confusion, decreased concentration, dysphoric mood and hallucinations. The patient is nervous/anxious.        Objective:   Physical Exam  Constitutional: She is oriented to person, place, and time. She appears well-developed and well-nourished.  Cardiovascular: Normal rate, regular rhythm, normal heart sounds and intact distal pulses.   Pulmonary/Chest: Effort normal and breath sounds normal.  Neurological: She is alert and oriented to person, place, and time.  Psychiatric: She has a normal mood and affect. Her behavior is normal. Thought content normal.          Assessment & Plan:  Anxiety, this time we will treat with Zoloft, beginning with a low dose of 25 mg daily. I advised her to take it with food. Recheck in 3-4 weeks.  Mckenzie CraneStephen Sylvain Hasten, MD

## 2017-02-14 ENCOUNTER — Telehealth: Payer: Self-pay | Admitting: Family Medicine

## 2017-02-14 NOTE — Telephone Encounter (Signed)
Increase the dose to 50 mg daily. She can double up on the 25's she has or we can call in 50 mg to take once daily, #30 with 2 rf

## 2017-02-14 NOTE — Telephone Encounter (Signed)
° ° °  Pt call to say she still chopping down on her jaw and is asking if she need to increase the below med     sertraline (ZOLOFT) 25 MG tablet

## 2017-02-15 ENCOUNTER — Encounter: Payer: Self-pay | Admitting: Family Medicine

## 2017-02-15 MED ORDER — SERTRALINE HCL 50 MG PO TABS: 50.0000 mg | ORAL_TABLET | Freq: Every day | ORAL | 3 refills | Status: DC

## 2017-02-15 NOTE — Telephone Encounter (Signed)
Spoke with pt and increased her Zoloft to 50 mg daily New Rx sent to [facility]

## 2017-02-18 NOTE — Telephone Encounter (Signed)
Increase the Zoloft to 50 mg daily. Call in #30 with 5 rf

## 2017-09-15 ENCOUNTER — Encounter: Payer: Self-pay | Admitting: Family Medicine

## 2018-09-05 ENCOUNTER — Telehealth: Payer: Commercial Managed Care - PPO | Admitting: Family

## 2018-09-05 DIAGNOSIS — R300 Dysuria: Secondary | ICD-10-CM

## 2018-09-05 NOTE — Progress Notes (Signed)
Based on what you shared with me it looks like you have a serious condition that should be evaluated in a face to face office visit.  NOTE: If you entered your credit card information for this eVisit, you will not be charged. You may see a "hold" on your card for the $30 but that hold will drop off and you will not have a charge processed.  If you are having a true medical emergency please call 911.  If you need an urgent face to face visit, [facility] has four urgent care centers for your convenience.  If you need care fast and have a high deductible or no insurance consider:   [email or web address] to reserve your spot online an avoid wait times  [facility] [address], [address] [place], [place] [address] 8 am to 8 pm Monday-Friday 10 am to 4 pm Saturday-Sunday *Across the street from [employer]  [facility]  [address] [place], [address] 8 am to 5 pm Monday-Friday * In the [facility]   The following sites will take your  insurance:  . [facility]  [phone] Get Driving Directions Find a Provider at this Location  [address] [place], [place] [address] . 10 am to 8 pm Monday-Friday . 12 pm to 8 pm Saturday-Sunday   . [facility]  [phone] Get Driving Directions Find a Provider at this Location  [address], [address] [place], [place] [address] . 8 am to 8 pm Monday-Friday . 9 am to 6 pm Saturday . 11 am to 6 pm Sunday   . [facility] [place]  [phone] Get Driving Directions  [address] [place],  [address] . 8 am to 8 pm Monday-Friday . 8 am to 4 pm Saturday-Sunday   Your e-visit answers were reviewed by a board certified advanced clinical practitioner to complete your personal care plan.  Thank you for using e-Visits.  

## 2018-09-08 ENCOUNTER — Ambulatory Visit: Payer: Commercial Managed Care - PPO | Admitting: Family Medicine

## 2018-09-08 ENCOUNTER — Ambulatory Visit: Payer: Managed Care, Other (non HMO) | Admitting: Family Medicine

## 2018-09-08 ENCOUNTER — Encounter: Payer: Self-pay | Admitting: Family Medicine

## 2018-09-08 VITALS — BP 120/70 | HR 78 | Temp 98.1°F | Ht 63.0 in | Wt 130.0 lb

## 2018-09-08 DIAGNOSIS — N390 Urinary tract infection, site not specified: Secondary | ICD-10-CM

## 2018-09-08 DIAGNOSIS — R350 Frequency of micturition: Secondary | ICD-10-CM

## 2018-09-08 LAB — POCT URINALYSIS DIPSTICK
BILIRUBIN UA: NEGATIVE
Glucose, UA: NEGATIVE
KETONES UA: NEGATIVE
Leukocytes, UA: NEGATIVE
NITRITE UA: POSITIVE
PH UA: 6 (ref 5.0–8.0)
Protein, UA: POSITIVE — AB
RBC UA: NEGATIVE
Spec Grav, UA: >=1.03 — AB (ref 1.010–1.025)
UROBILINOGEN UA: 0.2 U/dL

## 2018-09-08 MED ORDER — SERTRALINE HCL 50 MG PO TABS: 50.0000 mg | ORAL_TABLET | Freq: Every day | ORAL | 3 refills | Status: DC

## 2018-09-08 MED ORDER — CIPROFLOXACIN HCL 500 MG PO TABS: 500.0000 mg | ORAL_TABLET | Freq: Two times a day (BID) | ORAL | 0 refills | Status: DC

## 2018-09-08 NOTE — Progress Notes (Signed)
   Subjective:    Patient ID: Ledora BottcherPaige M Iglehart, female    DOB: 06/30/1991, 27 y.o.   MRN: [id number]  HPI Here for 3 months of intermittent burning on urination. No back pain or fever. She has used AZO off and on.    Review of Systems  Constitutional: Negative.   Respiratory: Negative.   Cardiovascular: Negative.   Gastrointestinal: Negative.   Genitourinary: Positive for dysuria, frequency and urgency. Negative for hematuria.       Objective:   Physical Exam  Constitutional: She appears well-developed and well-nourished.  Cardiovascular: Normal rate, regular rhythm, normal heart sounds and intact distal pulses.  Pulmonary/Chest: Effort normal and breath sounds normal.  Abdominal: Soft. Bowel sounds are normal. She exhibits no distension and no mass. There is no tenderness. There is no rebound and no guarding.          Assessment & Plan:  UTI, treat with Cipro. The sample is sent for a culture. She will drink plenty of water.  Gershon CraneStephen Fleurette Woolbright, MD

## 2018-09-10 LAB — URINE CULTURE
MICRO NUMBER:: 91100349
SPECIMEN QUALITY: ADEQUATE

## 2018-09-11 ENCOUNTER — Encounter: Payer: Self-pay | Admitting: *Deleted

## 2018-12-16 ENCOUNTER — Other Ambulatory Visit: Payer: Self-pay | Admitting: Family Medicine

## 2018-12-16 ENCOUNTER — Telehealth: Payer: Managed Care, Other (non HMO) | Admitting: Physician Assistant

## 2018-12-16 DIAGNOSIS — R300 Dysuria: Secondary | ICD-10-CM

## 2018-12-16 MED ORDER — CEPHALEXIN 500 MG PO CAPS: 500.0000 mg | ORAL_CAPSULE | Freq: Two times a day (BID) | ORAL | 0 refills | Status: AC

## 2018-12-16 NOTE — Progress Notes (Signed)
Further questions sent to patient for answer.

## 2018-12-16 NOTE — Progress Notes (Signed)

## 2019-01-04 ENCOUNTER — Encounter: Payer: Self-pay | Admitting: Family Medicine

## 2019-01-04 NOTE — Telephone Encounter (Signed)
Dr Fry please advise. thanks 

## 2019-01-05 ENCOUNTER — Telehealth: Payer: Managed Care, Other (non HMO) | Admitting: Family

## 2019-01-05 DIAGNOSIS — J208 Acute bronchitis due to other specified organisms: Secondary | ICD-10-CM | POA: Diagnosis not present

## 2019-01-05 DIAGNOSIS — B9689 Other specified bacterial agents as the cause of diseases classified elsewhere: Secondary | ICD-10-CM | POA: Diagnosis not present

## 2019-01-05 MED ORDER — DOXYCYCLINE HYCLATE 100 MG PO TABS: 100.0000 mg | ORAL_TABLET | Freq: Two times a day (BID) | ORAL | 0 refills | Status: DC

## 2019-01-05 MED ORDER — BENZONATATE 200 MG PO CAPS: 200.0000 mg | ORAL_CAPSULE | Freq: Two times a day (BID) | ORAL | 0 refills | Status: DC | PRN

## 2019-01-05 MED ORDER — BENZONATATE 100 MG PO CAPS: 100.0000 mg | ORAL_CAPSULE | Freq: Three times a day (TID) | ORAL | 0 refills | Status: DC | PRN

## 2019-01-05 MED ORDER — PREDNISONE 5 MG PO TABS: 5.0000 mg | ORAL_TABLET | ORAL | 0 refills | Status: DC

## 2019-01-05 MED ORDER — ALBUTEROL SULFATE HFA 108 (90 BASE) MCG/ACT IN AERS: 2.0000 | INHALATION_SPRAY | Freq: Four times a day (QID) | RESPIRATORY_TRACT | 0 refills | Status: DC | PRN

## 2019-01-05 NOTE — Progress Notes (Signed)
Thank you for the details you included in the comment boxes. Those details are very helpful in determining the best course of treatment for you and help us to provide the best care.  We are sorry that you are not feeling well.  Here is how we plan to help!  Based on your presentation I believe you most likely have A cough due to bacteria.  When patients have a fever and a productive cough with a change in color or increased sputum production, we are concerned about bacterial bronchitis.  If left untreated it can progress to pneumonia.  If your symptoms do not improve with your treatment plan it is important that you contact your provider.   I have prescribed Doxycycline 100 mg twice a day for 7 days     In addition you may use A non-prescription cough medication called Mucinex DM: take 2 tablets every 12 hours. and A prescription cough medication called Tessalon Perles 100mg. You may take 1-2 capsules every 8 hours as needed for your cough.   I have also added an Albuterol inhaler, take 2 puffs every 6 hours as needed for shortness of breath.   Prednisone 5 mg daily for 6 days (see taper instructions below)  Directions for 6 day taper: Day 1: 2 tablets before breakfast, 1 after both lunch & dinner and 2 at bedtime Day 2: 1 tab before breakfast, 1 after both lunch & dinner and 2 at bedtime Day 3: 1 tab at each meal & 1 at bedtime Day 4: 1 tab at breakfast, 1 at lunch, 1 at bedtime Day 5: 1 tab at breakfast & 1 tab at bedtime Day 6: 1 tab at breakfast   From your responses in the eVisit questionnaire you describe inflammation in the upper respiratory tract which is causing a significant cough.  This is commonly called Bronchitis and has four common causes:    Allergies  Viral Infections  Acid Reflux  Bacterial Infection Allergies, viruses and acid reflux are treated by controlling symptoms or eliminating the cause. An example might be a cough caused by taking certain blood pressure  medications. You stop the cough by changing the medication. Another example might be a cough caused by acid reflux. Controlling the reflux helps control the cough.  USE OF BRONCHODILATOR ("RESCUE") INHALERS: There is a risk from using your bronchodilator too frequently.  The risk is that over-reliance on a medication which only relaxes the muscles surrounding the breathing tubes can reduce the effectiveness of medications prescribed to reduce swelling and congestion of the tubes themselves.  Although you feel brief relief from the bronchodilator inhaler, your asthma may actually be worsening with the tubes becoming more swollen and filled with mucus.  This can delay other crucial treatments, such as oral steroid medications. If you need to use a bronchodilator inhaler daily, several times per day, you should discuss this with your provider.  There are probably better treatments that could be used to keep your asthma under control.     HOME CARE . Only take medications as instructed by your medical team. . Complete the entire course of an antibiotic. . Drink plenty of fluids and get plenty of rest. . Avoid close contacts especially the very young and the elderly . Cover your mouth if you cough or cough into your sleeve. . Always remember to wash your hands . A steam or ultrasonic humidifier can help congestion.   GET HELP RIGHT AWAY IF: . You develop worsening fever. .   You become short of breath . You cough up blood. . Your symptoms persist after you have completed your treatment plan MAKE SURE YOU   Understand these instructions.  Will watch your condition.  Will get help right away if you are not doing well or get worse.  Your e-visit answers were reviewed by a board certified advanced clinical practitioner to complete your personal care plan.  Depending on the condition, your plan could have included both over the counter or prescription medications. If there is a problem please reply   once you have received a response from your provider. Your safety is important to [place].  If you have drug allergies check your prescription carefully.    You can use MyChart to ask questions about today's visit, request a non-urgent call back, or ask for a work or school excuse for 24 hours related to this e-Visit. If it has been greater than 24 hours you will need to follow up with your provider, or enter a new e-Visit to address those concerns. You will get an e-mail in the next two days asking about your experience.  I hope that your e-visit has been valuable and will speed your recovery. Thank you for using e-visits.

## 2019-01-05 NOTE — Telephone Encounter (Signed)
Call in Benzonatate 200 mg to take bid prn cough, #30 with no rf

## 2020-03-07 ENCOUNTER — Other Ambulatory Visit: Payer: Self-pay

## 2020-03-10 ENCOUNTER — Encounter: Payer: Self-pay | Admitting: Family Medicine

## 2020-03-10 ENCOUNTER — Ambulatory Visit (INDEPENDENT_AMBULATORY_CARE_PROVIDER_SITE_OTHER): Payer: No Typology Code available for payment source | Admitting: Family Medicine

## 2020-03-10 VITALS — BP 128/82 | HR 84 | Temp 98.2°F | Ht 63.0 in | Wt 136.6 lb

## 2020-03-10 DIAGNOSIS — Z0000 Encounter for general adult medical examination without abnormal findings: Secondary | ICD-10-CM

## 2020-03-10 LAB — BASIC METABOLIC PANEL
BUN: 15 mg/dL (ref 6–23)
CO2: 27 mEq/L (ref 19–32)
Calcium: 9.1 mg/dL (ref 8.4–10.5)
Chloride: 104 mEq/L (ref 96–112)
Creatinine, Ser: 0.83 mg/dL (ref 0.40–1.20)
GFR: 81.94 mL/min (ref >60.00)
Glucose, Bld: 79 mg/dL (ref 70–99)
Potassium: 4.4 mEq/L (ref 3.5–5.1)
Sodium: 139 mEq/L (ref 135–145)

## 2020-03-10 LAB — CBC WITH DIFFERENTIAL/PLATELET
Basophils Absolute: 0.1 10*3/uL (ref 0.0–0.1)
Basophils Relative: 0.8 % (ref 0.0–3.0)
Eosinophils Absolute: 0.3 10*3/uL (ref 0.0–0.7)
Eosinophils Relative: 2.7 % (ref 0.0–5.0)
HCT: 36.5 % (ref 36.0–46.0)
Hemoglobin: 11.9 g/dL — ABNORMAL LOW (ref 12.0–15.0)
Lymphocytes Relative: 18.5 % (ref 12.0–46.0)
Lymphs Abs: 1.9 10*3/uL (ref 0.7–4.0)
MCHC: 32.5 g/dL (ref 30.0–36.0)
MCV: 80.8 fl (ref 78.0–100.0)
Monocytes Absolute: 0.7 10*3/uL (ref 0.1–1.0)
Monocytes Relative: 7.1 % (ref 3.0–12.0)
Neutro Abs: 7.4 10*3/uL (ref 1.4–7.7)
Neutrophils Relative %: 70.9 % (ref 43.0–77.0)
Platelets: 355 10*3/uL (ref 150.0–400.0)
RBC: 4.51 Mil/uL (ref 3.87–5.11)
RDW: 15.5 % (ref 11.5–15.5)
WBC: 10.5 10*3/uL (ref 4.0–10.5)

## 2020-03-10 LAB — HEPATIC FUNCTION PANEL
ALT: 10 U/L (ref 0–35)
AST: 15 U/L (ref 0–37)
Albumin: 4.2 g/dL (ref 3.5–5.2)
Alkaline Phosphatase: 70 U/L (ref 39–117)
Bilirubin, Direct: 0.1 mg/dL (ref 0.0–0.3)
Total Bilirubin: 0.5 mg/dL (ref 0.2–1.2)
Total Protein: 7 g/dL (ref 6.0–8.3)

## 2020-03-10 LAB — TSH: TSH: 1.11 u[IU]/mL (ref 0.35–4.50)

## 2020-03-10 NOTE — Progress Notes (Signed)
Subjective:    Patient ID: Mckenzie Kelley, female    DOB: 08-29-1991, 29 y.o.   MRN: [id number]  HPI Here for a well exam. She feels fine but does want to discuss some spells that started about one year ago. During these spells she feels shaky, her hands tremble, her heart races, and she feels mildly SOB. No chest pain. These occur about every 6 weeks and they last about 20 minutes. She is currently on no prescription medication, though we used to treat her with Zoloft for anxiety. She took herself off this about a year ago. She does drink coffee in the mornings several days a week. And sometimes she orders an espresso drink from [employer] [place]. She takes Zyrtec for allergies but she never takes Sudafed. She is currently working at [employer], and she enjoys her job.    Review of Systems  Constitutional: Negative.   HENT: Negative.   Eyes: Negative.   Respiratory: Negative.   Cardiovascular: Positive for palpitations.  Gastrointestinal: Negative.   Genitourinary: Negative for decreased urine volume, difficulty urinating, dyspareunia, dysuria, enuresis, flank pain, frequency, hematuria, pelvic pain and urgency.  Musculoskeletal: Negative.   Skin: Negative.   Neurological: Positive for tremors.  Psychiatric/Behavioral: Negative.        Objective:   Physical Exam Constitutional:      General: She is not in acute distress.    Appearance: She is well-developed.  HENT:     Head: Normocephalic and atraumatic.     Right Ear: External ear normal.     Left Ear: External ear normal.     Nose: Nose normal.     Mouth/Throat:     Pharynx: No oropharyngeal exudate.  Eyes:     General: No scleral icterus.    Conjunctiva/sclera: Conjunctivae normal.     Pupils: Pupils are equal, round, and reactive to light.  Neck:     Thyroid: No thyromegaly.     Vascular: No JVD.  Cardiovascular:     Rate and Rhythm: Normal rate and regular rhythm.     Heart sounds: Normal heart sounds. No  murmur. No friction rub. No gallop.   Pulmonary:     Effort: Pulmonary effort is normal. No respiratory distress.     Breath sounds: Normal breath sounds. No wheezing or rales.  Chest:     Chest wall: No tenderness.  Abdominal:     General: Bowel sounds are normal. There is no distension.     Palpations: Abdomen is soft. There is no mass.     Tenderness: There is no abdominal tenderness. There is no guarding or rebound.  Musculoskeletal:        General: No tenderness. Normal range of motion.     Cervical back: Normal range of motion and neck supple.  Lymphadenopathy:     Cervical: No cervical adenopathy.  Skin:    General: Skin is warm and dry.     Findings: No erythema or rash.  Neurological:     Mental Status: She is alert and oriented to person, place, and time.     Cranial Nerves: No cranial nerve deficit.     Motor: No abnormal muscle tone.     Coordination: Coordination normal.     Deep Tendon Reflexes: Reflexes are normal and symmetric. Reflexes normal.  Psychiatric:        Behavior: Behavior normal.        Thought Content: Thought content normal.        Judgment: Judgment normal.  Assessment & Plan:  Well exam. We discussed diet and exercise. I think the spells she describes are due to anxiety, and sometimes caffeine may contribute to them. We will get labs today to be complete, including a TSH.  Gershon Crane, MD

## 2020-04-16 ENCOUNTER — Encounter: Payer: Self-pay | Admitting: Family Medicine

## 2020-04-16 NOTE — Telephone Encounter (Signed)
Please advise 

## 2020-04-22 ENCOUNTER — Encounter: Payer: Self-pay | Admitting: Family Medicine

## 2020-04-22 NOTE — Telephone Encounter (Signed)
Please advise. The patient would like to start back on an anxiety medication.

## 2020-04-23 MED ORDER — SERTRALINE HCL 50 MG PO TABS: 50.0000 mg | ORAL_TABLET | Freq: Every day | ORAL | 5 refills | Status: DC

## 2020-04-23 NOTE — Telephone Encounter (Signed)
Please call in Zoloft 50 mg daily, #30 with 5 rf

## 2020-07-23 ENCOUNTER — Encounter: Payer: Self-pay | Admitting: Family Medicine

## 2020-07-23 NOTE — Telephone Encounter (Signed)
Try Xyzal pills OTC

## 2020-09-04 ENCOUNTER — Encounter: Payer: Self-pay | Admitting: Family Medicine

## 2020-09-05 NOTE — Telephone Encounter (Signed)
We can get a blood sample to test for varicella antibodies to prove if you are immune. I will order this if that is what she wants to do

## 2020-10-27 ENCOUNTER — Encounter: Payer: Self-pay | Admitting: Family Medicine

## 2020-10-28 MED ORDER — SCOPOLAMINE 1 MG/3DAYS TD PT72: 1.0000 | MEDICATED_PATCH | TRANSDERMAL | 2 refills | Status: DC

## 2020-10-28 NOTE — Telephone Encounter (Signed)
I sent in the patches (its called scopolamine)

## 2020-10-31 ENCOUNTER — Telehealth: Payer: Self-pay | Admitting: *Deleted

## 2020-10-31 NOTE — Telephone Encounter (Signed)
Patient returned call

## 2020-10-31 NOTE — Telephone Encounter (Signed)
Notified pt ----Rx patch been sent to the pharmacy.

## 2020-11-06 ENCOUNTER — Encounter: Payer: Self-pay | Admitting: Family Medicine

## 2020-11-09 ENCOUNTER — Other Ambulatory Visit: Payer: Self-pay | Admitting: Family Medicine

## 2020-11-10 MED ORDER — ONDANSETRON HCL 8 MG PO TABS: 8.0000 mg | ORAL_TABLET | Freq: Four times a day (QID) | ORAL | 2 refills | Status: DC | PRN

## 2020-11-10 NOTE — Telephone Encounter (Signed)
Done

## 2021-01-23 ENCOUNTER — Encounter: Payer: Self-pay | Admitting: Family Medicine

## 2021-01-23 NOTE — Telephone Encounter (Signed)
This sounds like vertigo, which is a common inner ear problem. Motion makes it worse, keeping still makes it better. It usually goes away in a day or so. Drink plenty of fluids and take an antihistamine like Zyrtec twice a day until it passes

## 2021-02-13 NOTE — Telephone Encounter (Signed)
Called patient and made an appointment for Friday 02/20/21

## 2021-02-13 NOTE — Telephone Encounter (Signed)
See me next week if this is still not gone by then

## 2021-02-19 ENCOUNTER — Other Ambulatory Visit: Payer: Self-pay

## 2021-02-20 ENCOUNTER — Ambulatory Visit (INDEPENDENT_AMBULATORY_CARE_PROVIDER_SITE_OTHER): Payer: PRIVATE HEALTH INSURANCE | Admitting: Family Medicine

## 2021-02-20 ENCOUNTER — Encounter: Payer: Self-pay | Admitting: Family Medicine

## 2021-02-20 VITALS — BP 118/72 | Temp 98.7°F | Ht 64.0 in | Wt 147.0 lb

## 2021-02-20 DIAGNOSIS — H9201 Otalgia, right ear: Secondary | ICD-10-CM | POA: Diagnosis not present

## 2021-02-20 DIAGNOSIS — H6121 Impacted cerumen, right ear: Secondary | ICD-10-CM | POA: Diagnosis not present

## 2021-02-20 DIAGNOSIS — R42 Dizziness and giddiness: Secondary | ICD-10-CM | POA: Diagnosis not present

## 2021-02-20 NOTE — Progress Notes (Signed)
   Subjective:    Patient ID: Mckenzie Kelley, female    DOB: 1991/11/01, 30 y.o.   MRN: [id number]  HPI Here for 4 weeks of intermittent dizziness which makes her feel as if the room is spinning around her. Sometimes she gets nauseated with this, but she only vomited one time. No headache. She takes Xyzal daily for allergies.    Review of Systems  Constitutional: Negative.   HENT: Negative.   Eyes: Negative.   Respiratory: Negative.   Cardiovascular: Negative.   Neurological: Positive for dizziness. Negative for headaches.       Objective:   Physical Exam Constitutional:      Appearance: Normal appearance.  HENT:     Head: Normocephalic and atraumatic.     Right Ear: External ear normal.     Left Ear: Tympanic membrane, ear canal and external ear normal.     Ears:     Comments: Right ear canal is full of cerumen     Nose: Nose normal.     Mouth/Throat:     Pharynx: Oropharynx is clear.  Eyes:     Conjunctiva/sclera: Conjunctivae normal.  Cardiovascular:     Rate and Rhythm: Normal rate and regular rhythm.     Pulses: Normal pulses.     Heart sounds: Normal heart sounds.  Pulmonary:     Effort: Pulmonary effort is normal.     Breath sounds: Normal breath sounds.  Lymphadenopathy:     Cervical: No cervical adenopathy.  Neurological:     Mental Status: She is alert.           Assessment & Plan:  Vertigo, which may be the result of a cerumen impaction and/or allergies. We attempted to irrigate the canal with water but had to stop due to pain. We will refer her to ENT for this.  I also suggested she begin using Flonase nasal sprays daily along with the Shanda Howells. Gershon Crane, MD

## 2021-02-28 ENCOUNTER — Other Ambulatory Visit: Payer: Self-pay | Admitting: Family Medicine

## 2021-03-31 ENCOUNTER — Encounter: Payer: Self-pay | Admitting: Family Medicine

## 2021-03-31 DIAGNOSIS — J3089 Other allergic rhinitis: Secondary | ICD-10-CM

## 2021-03-31 NOTE — Telephone Encounter (Signed)
She may be a good candidate for allergy shots. I suggest she see an allergy specialist for testing. If she agrees, I will refer her

## 2021-04-01 ENCOUNTER — Encounter: Payer: Self-pay | Admitting: Family Medicine

## 2021-04-01 NOTE — Telephone Encounter (Signed)
The referral was done  

## 2021-06-27 ENCOUNTER — Other Ambulatory Visit: Payer: Self-pay | Admitting: Family Medicine

## 2021-11-15 ENCOUNTER — Other Ambulatory Visit: Payer: Self-pay | Admitting: Family Medicine

## 2021-11-23 ENCOUNTER — Other Ambulatory Visit: Payer: Self-pay

## 2021-11-23 ENCOUNTER — Encounter: Payer: Self-pay | Admitting: Family Medicine

## 2021-11-23 MED ORDER — SERTRALINE HCL 50 MG PO TABS: 50.0000 mg | ORAL_TABLET | Freq: Every day | ORAL | 0 refills | Status: DC

## 2022-02-16 ENCOUNTER — Encounter: Payer: Self-pay | Admitting: Family Medicine

## 2022-02-16 ENCOUNTER — Other Ambulatory Visit: Payer: Self-pay | Admitting: Family Medicine

## 2022-02-16 DIAGNOSIS — F411 Generalized anxiety disorder: Secondary | ICD-10-CM

## 2022-02-16 MED ORDER — SERTRALINE HCL 50 MG PO TABS: 50.0000 mg | ORAL_TABLET | Freq: Every day | ORAL | 0 refills | Status: DC

## 2022-06-08 ENCOUNTER — Encounter: Payer: PRIVATE HEALTH INSURANCE | Admitting: Family Medicine

## 2022-06-15 ENCOUNTER — Ambulatory Visit (INDEPENDENT_AMBULATORY_CARE_PROVIDER_SITE_OTHER): Payer: PRIVATE HEALTH INSURANCE | Admitting: Family Medicine

## 2022-06-15 ENCOUNTER — Encounter: Payer: Self-pay | Admitting: Family Medicine

## 2022-06-15 VITALS — BP 110/74 | Temp 98.9°F | Ht 64.0 in | Wt 136.5 lb

## 2022-06-15 DIAGNOSIS — F411 Generalized anxiety disorder: Secondary | ICD-10-CM

## 2022-06-15 DIAGNOSIS — Z23 Encounter for immunization: Secondary | ICD-10-CM

## 2022-06-15 DIAGNOSIS — Z0000 Encounter for general adult medical examination without abnormal findings: Secondary | ICD-10-CM

## 2022-06-15 MED ORDER — SERTRALINE HCL 50 MG PO TABS: 50.0000 mg | ORAL_TABLET | Freq: Every day | ORAL | 3 refills | Status: DC

## 2022-06-15 NOTE — Progress Notes (Signed)
   Subjective:    Patient ID: Mckenzie Kelley, female    DOB: 1991-03-28, 31 y.o.   MRN: [id number]  HPI Here for a well exam. She feels good and has no concerns.    Review of Systems  Constitutional: Negative.   HENT: Negative.    Eyes: Negative.   Respiratory: Negative.    Cardiovascular: Negative.   Gastrointestinal: Negative.   Genitourinary:  Negative for decreased urine volume, difficulty urinating, dyspareunia, dysuria, enuresis, flank pain, frequency, hematuria, pelvic pain and urgency.  Musculoskeletal: Negative.   Skin: Negative.   Neurological: Negative.  Negative for headaches.  Psychiatric/Behavioral: Negative.         Objective:   Physical Exam Constitutional:      General: She is not in acute distress.    Appearance: Normal appearance. She is well-developed.  HENT:     Head: Normocephalic and atraumatic.     Right Ear: External ear normal.     Left Ear: External ear normal.     Nose: Nose normal.     Mouth/Throat:     Pharynx: No oropharyngeal exudate.  Eyes:     General: No scleral icterus.    Conjunctiva/sclera: Conjunctivae normal.     Pupils: Pupils are equal, round, and reactive to light.  Neck:     Thyroid: No thyromegaly.     Vascular: No JVD.  Cardiovascular:     Rate and Rhythm: Normal rate and regular rhythm.     Heart sounds: Normal heart sounds. No murmur heard.    No friction rub. No gallop.  Pulmonary:     Effort: Pulmonary effort is normal. No respiratory distress.     Breath sounds: Normal breath sounds. No wheezing or rales.  Chest:     Chest wall: No tenderness.  Abdominal:     General: Bowel sounds are normal. There is no distension.     Palpations: Abdomen is soft. There is no mass.     Tenderness: There is no abdominal tenderness. There is no guarding or rebound.  Musculoskeletal:        General: No tenderness. Normal range of motion.     Cervical back: Normal range of motion and neck supple.  Lymphadenopathy:      Cervical: No cervical adenopathy.  Skin:    General: Skin is warm and dry.     Findings: No erythema or rash.  Neurological:     Mental Status: She is alert and oriented to person, place, and time.     Cranial Nerves: No cranial nerve deficit.     Motor: No abnormal muscle tone.     Coordination: Coordination normal.     Deep Tendon Reflexes: Reflexes are normal and symmetric. Reflexes normal.  Psychiatric:        Behavior: Behavior normal.        Thought Content: Thought content normal.        Judgment: Judgment normal.           Assessment & Plan:  Well exam. We discussed diet and exercise. Get fasting labs soon. Gershon Crane, MD

## 2022-06-16 ENCOUNTER — Encounter: Payer: Self-pay | Admitting: Family Medicine

## 2022-06-16 NOTE — Telephone Encounter (Signed)
I guess we will have to wait until the lab results come back. She may have anemia or something else. We will call her

## 2022-06-22 ENCOUNTER — Other Ambulatory Visit (INDEPENDENT_AMBULATORY_CARE_PROVIDER_SITE_OTHER): Payer: PRIVATE HEALTH INSURANCE

## 2022-06-22 DIAGNOSIS — Z0000 Encounter for general adult medical examination without abnormal findings: Secondary | ICD-10-CM | POA: Diagnosis not present

## 2022-06-22 LAB — CBC WITH DIFFERENTIAL/PLATELET
Basophils Absolute: 0.1 10*3/uL (ref 0.0–0.1)
Basophils Relative: 0.9 % (ref 0.0–3.0)
Eosinophils Absolute: 0.1 10*3/uL (ref 0.0–0.7)
Eosinophils Relative: 2 % (ref 0.0–5.0)
HCT: 31.5 % — ABNORMAL LOW (ref 36.0–46.0)
Hemoglobin: 9.9 g/dL — ABNORMAL LOW (ref 12.0–15.0)
Lymphocytes Relative: 28.1 % (ref 12.0–46.0)
Lymphs Abs: 1.6 10*3/uL (ref 0.7–4.0)
MCHC: 31.3 g/dL (ref 30.0–36.0)
MCV: 70.2 fl — ABNORMAL LOW (ref 78.0–100.0)
Monocytes Absolute: 0.5 10*3/uL (ref 0.1–1.0)
Monocytes Relative: 8.4 % (ref 3.0–12.0)
Neutro Abs: 3.5 10*3/uL (ref 1.4–7.7)
Neutrophils Relative %: 60.6 % (ref 43.0–77.0)
Platelets: 318 10*3/uL (ref 150.0–400.0)
RBC: 4.48 Mil/uL (ref 3.87–5.11)
RDW: 21.4 % — ABNORMAL HIGH (ref 11.5–15.5)
WBC: 5.8 10*3/uL (ref 4.0–10.5)

## 2022-06-22 LAB — HEPATIC FUNCTION PANEL
ALT: 11 U/L (ref 0–35)
AST: 18 U/L (ref 0–37)
Albumin: 4.2 g/dL (ref 3.5–5.2)
Alkaline Phosphatase: 63 U/L (ref 39–117)
Bilirubin, Direct: 0.1 mg/dL (ref 0.0–0.3)
Total Bilirubin: 0.3 mg/dL (ref 0.2–1.2)
Total Protein: 7 g/dL (ref 6.0–8.3)

## 2022-06-22 LAB — LIPID PANEL
Cholesterol: 154 mg/dL (ref 0–200)
HDL: 59.7 mg/dL (ref >39.00)
LDL Cholesterol: 80 mg/dL (ref 0–99)
NonHDL: 94.79
Total CHOL/HDL Ratio: 3
Triglycerides: 74 mg/dL (ref 0.0–149.0)
VLDL: 14.8 mg/dL (ref 0.0–40.0)

## 2022-06-22 LAB — BASIC METABOLIC PANEL
BUN: 14 mg/dL (ref 6–23)
CO2: 26 mEq/L (ref 19–32)
Calcium: 9 mg/dL (ref 8.4–10.5)
Chloride: 104 mEq/L (ref 96–112)
Creatinine, Ser: 0.75 mg/dL (ref 0.40–1.20)
GFR: 107.05 mL/min (ref >60.00)
Glucose, Bld: 73 mg/dL (ref 70–99)
Potassium: 4.5 mEq/L (ref 3.5–5.1)
Sodium: 137 mEq/L (ref 135–145)

## 2022-06-22 LAB — HEMOGLOBIN A1C: Hgb A1c MFr Bld: 5.5 % (ref 4.6–6.5)

## 2022-06-22 LAB — TSH: TSH: 2.73 u[IU]/mL (ref 0.35–5.50)

## 2022-06-28 NOTE — Telephone Encounter (Signed)
Yes she is anemic and that is why she feels so tired. We will start her on iron pills (see my Result Note for her)

## 2022-07-01 ENCOUNTER — Other Ambulatory Visit: Payer: Self-pay

## 2022-07-01 MED ORDER — IRON (FERROUS SULFATE) 325 (65 FE) MG PO TABS: 325.0000 mg | ORAL_TABLET | Freq: Two times a day (BID) | ORAL | 3 refills | Status: DC

## 2022-07-07 NOTE — Telephone Encounter (Signed)
Reviewed lab results with pt, aware that she is anemic and that new Rx for ferrous sulfate was sent  to her pharmacy

## 2022-07-28 ENCOUNTER — Telehealth: Payer: PRIVATE HEALTH INSURANCE | Admitting: Physician Assistant

## 2022-07-28 DIAGNOSIS — J208 Acute bronchitis due to other specified organisms: Secondary | ICD-10-CM | POA: Diagnosis not present

## 2022-07-28 MED ORDER — BENZONATATE 100 MG PO CAPS: 100.0000 mg | ORAL_CAPSULE | Freq: Three times a day (TID) | ORAL | 0 refills | Status: DC | PRN

## 2022-07-28 MED ORDER — PREDNISONE 20 MG PO TABS: 40.0000 mg | ORAL_TABLET | Freq: Every day | ORAL | 0 refills | Status: DC

## 2022-07-28 NOTE — Progress Notes (Signed)
We are sorry that you are not feeling well.  Here is how we plan to help!  Based on your presentation I believe you most likely have A cough due to a virus.  This is called viral bronchitis and is best treated by rest, plenty of fluids and control of the cough.  You may use Ibuprofen or Tylenol as directed to help your symptoms.     In addition you may use A prescription cough medication called Tessalon Perles 100mg . You may take 1-2 capsules every 8 hours as needed for your cough. I have also sent in a prednisone burst to help further calm cough, resolve wheezing and chest tightness.   From your responses in the eVisit questionnaire you describe inflammation in the upper respiratory tract which is causing a significant cough.  This is commonly called Bronchitis and has four common causes:   Allergies Viral Infections Acid Reflux Bacterial Infection Allergies, viruses and acid reflux are treated by controlling symptoms or eliminating the cause. An example might be a cough caused by taking certain blood pressure medications. You stop the cough by changing the medication. Another example might be a cough caused by acid reflux. Controlling the reflux helps control the cough.  USE OF BRONCHODILATOR ("RESCUE") INHALERS: There is a risk from using your bronchodilator too frequently.  The risk is that over-reliance on a medication which only relaxes the muscles surrounding the breathing tubes can reduce the effectiveness of medications prescribed to reduce swelling and congestion of the tubes themselves.  Although you feel brief relief from the bronchodilator inhaler, your asthma may actually be worsening with the tubes becoming more swollen and filled with mucus.  This can delay other crucial treatments, such as oral steroid medications. If you need to use a bronchodilator inhaler daily, several times per day, you should discuss this with your provider.  There are probably better treatments that could be  used to keep your asthma under control.     HOME CARE Only take medications as instructed by your medical team. Complete the entire course of an antibiotic. Drink plenty of fluids and get plenty of rest. Avoid close contacts especially the very young and the elderly Cover your mouth if you cough or cough into your sleeve. Always remember to wash your hands A steam or ultrasonic humidifier can help congestion.   GET HELP RIGHT AWAY IF: You develop worsening fever. You become short of breath You cough up blood. Your symptoms persist after you have completed your treatment plan MAKE SURE YOU  Understand these instructions. Will watch your condition. Will get help right away if you are not doing well or get worse.    Thank you for choosing an e-visit.  Your e-visit answers were reviewed by a board certified advanced clinical practitioner to complete your personal care plan. Depending upon the condition, your plan could have included both over the counter or prescription medications.  Please review your pharmacy choice. Make sure the pharmacy is open so you can pick up prescription now. If there is a problem, you may contact your provider through and have the prescription routed to another pharmacy.  Your safety is important to [employer]. If you have drug allergies check your prescription carefully.   For the next 24 hours you can use MyChart to ask questions about today's visit, request a non-urgent call back, or ask for a work or school excuse. You will get an email in the next two days asking about your experience.  I hope that your e-visit has been valuable and will speed your recovery.

## 2022-07-28 NOTE — Progress Notes (Signed)
I have spent 5 minutes in review of e-visit questionnaire, review and updating patient chart, medical decision making and response to patient.   Daiwik [place] Ryley Teater, PA-C    

## 2022-09-02 ENCOUNTER — Other Ambulatory Visit: Payer: Self-pay

## 2022-09-02 ENCOUNTER — Encounter: Payer: Self-pay | Admitting: Family Medicine

## 2022-09-02 DIAGNOSIS — E611 Iron deficiency: Secondary | ICD-10-CM

## 2022-09-22 ENCOUNTER — Other Ambulatory Visit (INDEPENDENT_AMBULATORY_CARE_PROVIDER_SITE_OTHER): Payer: No Typology Code available for payment source

## 2022-09-22 ENCOUNTER — Encounter: Payer: Self-pay | Admitting: Family Medicine

## 2022-09-22 DIAGNOSIS — E611 Iron deficiency: Secondary | ICD-10-CM

## 2022-09-22 LAB — CBC WITH DIFFERENTIAL/PLATELET
Basophils Absolute: 0.1 10*3/uL (ref 0.0–0.1)
Basophils Relative: 0.9 % (ref 0.0–3.0)
Eosinophils Absolute: 0.2 10*3/uL (ref 0.0–0.7)
Eosinophils Relative: 2.7 % (ref 0.0–5.0)
HCT: 41.9 % (ref 36.0–46.0)
Hemoglobin: 13.7 g/dL (ref 12.0–15.0)
Lymphocytes Relative: 30.2 % (ref 12.0–46.0)
Lymphs Abs: 1.9 10*3/uL (ref 0.7–4.0)
MCHC: 32.6 g/dL (ref 30.0–36.0)
MCV: 84.4 fl (ref 78.0–100.0)
Monocytes Absolute: 0.5 10*3/uL (ref 0.1–1.0)
Monocytes Relative: 8.4 % (ref 3.0–12.0)
Neutro Abs: 3.7 10*3/uL (ref 1.4–7.7)
Neutrophils Relative %: 57.8 % (ref 43.0–77.0)
Platelets: 264 10*3/uL (ref 150.0–400.0)
RBC: 4.96 Mil/uL (ref 3.87–5.11)
RDW: 20.1 % — ABNORMAL HIGH (ref 11.5–15.5)
WBC: 6.4 10*3/uL (ref 4.0–10.5)

## 2022-10-20 ENCOUNTER — Encounter: Payer: Self-pay | Admitting: Family Medicine

## 2022-10-20 MED ORDER — CIPROFLOXACIN HCL 500 MG PO TABS: 500.0000 mg | ORAL_TABLET | Freq: Two times a day (BID) | ORAL | 0 refills | Status: DC

## 2022-10-20 NOTE — Telephone Encounter (Signed)
No need for a sample. I sent in for a week of Cipro

## 2023-01-24 ENCOUNTER — Encounter: Payer: Self-pay | Admitting: Family Medicine

## 2023-01-26 ENCOUNTER — Ambulatory Visit (INDEPENDENT_AMBULATORY_CARE_PROVIDER_SITE_OTHER): Payer: No Typology Code available for payment source | Admitting: Family Medicine

## 2023-01-26 ENCOUNTER — Encounter: Payer: Self-pay | Admitting: Family Medicine

## 2023-01-26 VITALS — BP 102/68 | HR 70 | Temp 97.8°F | Wt 147.0 lb

## 2023-01-26 DIAGNOSIS — Z209 Contact with and (suspected) exposure to unspecified communicable disease: Secondary | ICD-10-CM | POA: Diagnosis not present

## 2023-01-26 MED ORDER — DOXYCYCLINE HYCLATE 100 MG PO CAPS: 100.0000 mg | ORAL_CAPSULE | Freq: Two times a day (BID) | ORAL | 0 refills | Status: AC

## 2023-01-26 NOTE — Progress Notes (Signed)
   Subjective:    Patient ID: Mckenzie Kelley, female    DOB: 05-01-1991, 32 y.o.   MRN: [id number]  HPI Here for STD testing after she learned that her boyfriend tested positive for Chlamydia 2 days ago. She denies any symptoms. They have had sex numerous times in the past week. Her last Pap smear was 2 years ago with Dr. Pamala Kelley. Her boyfriend is being treated.    Review of Systems  Constitutional: Negative.   Respiratory: Negative.    Cardiovascular: Negative.   Genitourinary: Negative.        Objective:   Physical Exam Constitutional:      Appearance: Normal appearance.  Cardiovascular:     Rate and Rhythm: Normal rate and regular rhythm.     Pulses: Normal pulses.     Heart sounds: Normal heart sounds.  Pulmonary:     Effort: Pulmonary effort is normal.     Breath sounds: Normal breath sounds.  Neurological:     Mental Status: She is alert.           Assessment & Plan:  Exposure to Chlamydia. We will treat her empirically with 10 days of Doxycycline. Test for syphilis, HIV, Chlamydia, and GC. I advised her to see her GYN asap for another Pap smear. Alysia Penna, MD

## 2023-01-27 LAB — HIV ANTIBODY (ROUTINE TESTING W REFLEX): HIV 1&2 Ab, 4th Generation: NONREACTIVE

## 2023-01-27 LAB — C. TRACHOMATIS/N. GONORRHOEAE RNA
C. trachomatis RNA, TMA: NOT DETECTED
N. gonorrhoeae RNA, TMA: NOT DETECTED

## 2023-01-27 LAB — RPR: RPR Ser Ql: NONREACTIVE

## 2023-01-28 ENCOUNTER — Encounter: Payer: Self-pay | Admitting: Family Medicine

## 2023-04-12 ENCOUNTER — Other Ambulatory Visit: Payer: Self-pay

## 2023-04-12 ENCOUNTER — Encounter: Payer: Self-pay | Admitting: Family Medicine

## 2023-04-12 DIAGNOSIS — F411 Generalized anxiety disorder: Secondary | ICD-10-CM

## 2023-04-12 MED ORDER — SERTRALINE HCL 50 MG PO TABS: 50.0000 mg | ORAL_TABLET | Freq: Every day | ORAL | 3 refills | Status: DC

## 2023-04-12 MED ORDER — METHYLPREDNISOLONE 4 MG PO TBPK: ORAL_TABLET | ORAL | 0 refills | Status: DC

## 2023-04-12 NOTE — Telephone Encounter (Signed)
I sent in a steroid taper pack and I renewed her Sertraline

## 2023-08-12 ENCOUNTER — Ambulatory Visit (INDEPENDENT_AMBULATORY_CARE_PROVIDER_SITE_OTHER): Payer: No Typology Code available for payment source | Admitting: Family Medicine

## 2023-08-12 ENCOUNTER — Encounter: Payer: Self-pay | Admitting: Family Medicine

## 2023-08-12 VITALS — BP 110/78 | HR 75 | Temp 98.2°F | Wt 152.0 lb

## 2023-08-12 DIAGNOSIS — R739 Hyperglycemia, unspecified: Secondary | ICD-10-CM

## 2023-08-12 DIAGNOSIS — F411 Generalized anxiety disorder: Secondary | ICD-10-CM | POA: Diagnosis not present

## 2023-08-12 DIAGNOSIS — D649 Anemia, unspecified: Secondary | ICD-10-CM | POA: Diagnosis not present

## 2023-08-12 DIAGNOSIS — R5383 Other fatigue: Secondary | ICD-10-CM

## 2023-08-12 LAB — CBC WITH DIFFERENTIAL/PLATELET
Basophils Absolute: 0.1 10*3/uL (ref 0.0–0.1)
Basophils Relative: 0.6 % (ref 0.0–3.0)
Eosinophils Absolute: 0.1 10*3/uL (ref 0.0–0.7)
Eosinophils Relative: 0.8 % (ref 0.0–5.0)
HCT: 38.7 % (ref 36.0–46.0)
Hemoglobin: 12.3 g/dL (ref 12.0–15.0)
Lymphocytes Relative: 29.1 % (ref 12.0–46.0)
Lymphs Abs: 2.5 10*3/uL (ref 0.7–4.0)
MCHC: 31.8 g/dL (ref 30.0–36.0)
MCV: 80.4 fl (ref 78.0–100.0)
Monocytes Absolute: 0.5 10*3/uL (ref 0.1–1.0)
Monocytes Relative: 6.2 % (ref 3.0–12.0)
Neutro Abs: 5.4 10*3/uL (ref 1.4–7.7)
Neutrophils Relative %: 63.3 % (ref 43.0–77.0)
Platelets: 338 10*3/uL (ref 150.0–400.0)
RBC: 4.81 Mil/uL (ref 3.87–5.11)
RDW: 15.3 % (ref 11.5–15.5)
WBC: 8.5 10*3/uL (ref 4.0–10.5)

## 2023-08-12 LAB — BASIC METABOLIC PANEL
BUN: 11 mg/dL (ref 6–23)
CO2: 27 meq/L (ref 19–32)
Calcium: 9.8 mg/dL (ref 8.4–10.5)
Chloride: 101 meq/L (ref 96–112)
Creatinine, Ser: 0.72 mg/dL (ref 0.40–1.20)
GFR: 111.52 mL/min (ref >60.00)
Glucose, Bld: 106 mg/dL — ABNORMAL HIGH (ref 70–99)
Potassium: 4.1 meq/L (ref 3.5–5.1)
Sodium: 135 mEq/L (ref 135–145)

## 2023-08-12 LAB — IBC + FERRITIN
Ferritin: 6.6 ng/mL — ABNORMAL LOW (ref 10.0–291.0)
Iron: 22 ug/dL — ABNORMAL LOW (ref 42–145)
Saturation Ratios: 4.6 % — ABNORMAL LOW (ref 20.0–50.0)
TIBC: 478.8 ug/dL — ABNORMAL HIGH (ref 250.0–450.0)
Transferrin: 342 mg/dL (ref 212.0–360.0)

## 2023-08-12 LAB — HEPATIC FUNCTION PANEL
ALT: 10 U/L (ref 0–35)
AST: 20 U/L (ref 0–37)
Albumin: 4.8 g/dL (ref 3.5–5.2)
Alkaline Phosphatase: 59 U/L (ref 39–117)
Bilirubin, Direct: 0.1 mg/dL (ref 0.0–0.3)
Total Bilirubin: 0.3 mg/dL (ref 0.2–1.2)
Total Protein: 7.8 g/dL (ref 6.0–8.3)

## 2023-08-12 LAB — FOLATE: Folate: 9.1 ng/mL (ref >5.9)

## 2023-08-12 LAB — TSH: TSH: 1.87 u[IU]/mL (ref 0.35–5.50)

## 2023-08-12 LAB — VITAMIN B12: Vitamin B-12: 1026 pg/mL — ABNORMAL HIGH (ref 211–911)

## 2023-08-12 NOTE — Progress Notes (Signed)
   Subjective:    Patient ID: Mckenzie Kelley, female    DOB: 05-21-1991, 32 y.o.   MRN: [id number]  HPI Here for anxiety and for fatigue. We did labs on her a year ago, and her Hgb was low at 9.9. She started taking iron pills, and they did give her a little more energy. However that caused her to have abdominal cramps and constipation, so she stopped taking them about 6 months ago. Now she has constant fatigue. She eats a balanced diet, and she sleeps well. Her menses are not particularly heavy, and they last about 5 days. She also describes being under a lot of stress. 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 She has been taking Zoloft 50 mg daily for several years now.    Review of Systems  Constitutional:  Positive for fatigue.  Respiratory: Negative.    Cardiovascular: Negative.   Gastrointestinal: Negative.   Genitourinary: Negative.   Neurological: Negative.   Psychiatric/Behavioral:  Negative for agitation, behavioral problems, confusion, decreased concentration, dysphoric mood and sleep disturbance. The patient is nervous/anxious.        Objective:   Physical Exam Constitutional:      Appearance: Normal appearance.  Cardiovascular:     Rate and Rhythm: Normal rate and regular rhythm.     Pulses: Normal pulses.     Heart sounds: Normal heart sounds.  Pulmonary:     Effort: Pulmonary effort is normal.     Breath sounds: Normal breath sounds.  Neurological:     General: No focal deficit present.     Mental Status: She is alert and oriented to person, place, and time.  Psychiatric:        Mood and Affect: Mood normal.        Behavior: Behavior normal.        Thought Content: Thought content normal.           Assessment & Plan:  For the fatigue, we will check labs again today, including CBC, iron, folate, and B12. For the anxiety, she will increase the Zoloft to 100 mg daily. Report back in  2-3 weeks.  Gershon Crane, MD

## 2023-08-15 ENCOUNTER — Other Ambulatory Visit: Payer: Self-pay

## 2023-08-15 ENCOUNTER — Encounter: Payer: Self-pay | Admitting: Family Medicine

## 2023-08-15 LAB — HEMOGLOBIN A1C: Hgb A1c MFr Bld: 5.5 % (ref 4.6–6.5)

## 2023-08-15 MED ORDER — IRON (FERROUS SULFATE) 325 (65 FE) MG PO TABS
325.0000 mg | ORAL_TABLET | Freq: Every day | ORAL | 3 refills | Status: DC
Start: 2023-08-15 — End: 2024-09-05

## 2023-08-17 NOTE — Telephone Encounter (Signed)
Reviewed pt lab results verbalized understanding

## 2023-09-19 ENCOUNTER — Encounter: Payer: Self-pay | Admitting: Family Medicine

## 2023-09-20 NOTE — Telephone Encounter (Signed)
As for the iron pills, yes she can take these every other day. As for meds to help with concentration, has she ever taken any sort of ADHD medication?

## 2023-09-21 ENCOUNTER — Telehealth: Payer: Self-pay | Admitting: Family Medicine

## 2023-09-21 DIAGNOSIS — F900 Attention-deficit hyperactivity disorder, predominantly inattentive type: Secondary | ICD-10-CM | POA: Insufficient documentation

## 2023-09-21 MED ORDER — AMPHETAMINE-DEXTROAMPHET ER 10 MG PO CP24: 10.0000 mg | ORAL_CAPSULE | Freq: Every day | ORAL | 0 refills | Status: DC

## 2023-09-21 NOTE — Telephone Encounter (Signed)
Fayrene Fearing from [facility] is requesting a call back.  He needs a diagnosis code for the new generic Adderall prescription.

## 2023-09-21 NOTE — Telephone Encounter (Signed)
I sent in a supply of Adderall XR for her to try

## 2023-09-21 NOTE — Telephone Encounter (Signed)
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

## 2023-09-22 NOTE — Telephone Encounter (Signed)
Spoke with pt pharmacy provided the diagnoses code per Dr Clent Ridges

## 2023-11-02 ENCOUNTER — Encounter: Payer: Self-pay | Admitting: Family Medicine

## 2023-11-07 NOTE — Telephone Encounter (Signed)
No I cannot think of anything else to offer about the fatigue. I am glad the 100 mg dose of Zoloft works better. If she wishes, call in 100 mg to take daily, #90 with 3 rf

## 2023-11-08 MED ORDER — SERTRALINE HCL 100 MG PO TABS: 100.0000 mg | ORAL_TABLET | Freq: Every day | ORAL | 3 refills | Status: DC

## 2023-11-14 ENCOUNTER — Other Ambulatory Visit: Payer: Self-pay | Admitting: Family Medicine

## 2023-11-16 NOTE — Telephone Encounter (Signed)
Pt LOV was 08/12/23 Pt last refill was done on 09/21/23 Please advise

## 2023-11-17 MED ORDER — AMPHETAMINE-DEXTROAMPHET ER 10 MG PO CP24: 10.0000 mg | ORAL_CAPSULE | Freq: Every day | ORAL | 0 refills | Status: DC

## 2023-11-17 NOTE — Telephone Encounter (Signed)
Done

## 2023-12-13 ENCOUNTER — Encounter: Payer: Self-pay | Admitting: Family Medicine

## 2023-12-14 MED ORDER — AMPHETAMINE-DEXTROAMPHET ER 20 MG PO CP24: 20.0000 mg | ORAL_CAPSULE | ORAL | 0 refills | Status: DC

## 2023-12-14 NOTE — Telephone Encounter (Signed)
I increased the dose to 20 mg. Have her report back to [place] in 4 weeks

## 2024-01-12 ENCOUNTER — Other Ambulatory Visit: Payer: Self-pay | Admitting: Family Medicine

## 2024-01-12 ENCOUNTER — Encounter: Payer: Self-pay | Admitting: Family Medicine

## 2024-01-16 NOTE — Telephone Encounter (Signed)
Pt LOV was 08/12/23 Please advise

## 2024-01-17 MED ORDER — AMPHETAMINE-DEXTROAMPHET ER 20 MG PO CP24: 20.0000 mg | ORAL_CAPSULE | ORAL | 0 refills | Status: DC

## 2024-01-17 NOTE — Telephone Encounter (Signed)
Done

## 2024-02-15 ENCOUNTER — Encounter: Payer: Self-pay | Admitting: Family Medicine

## 2024-02-20 NOTE — Telephone Encounter (Signed)
 Done

## 2024-02-22 ENCOUNTER — Other Ambulatory Visit: Payer: Self-pay | Admitting: Family Medicine

## 2024-02-23 ENCOUNTER — Other Ambulatory Visit: Payer: Self-pay | Admitting: Family Medicine

## 2024-02-24 NOTE — Telephone Encounter (Signed)
 Rx filled on 01/17/24

## 2024-03-26 ENCOUNTER — Other Ambulatory Visit: Payer: Self-pay | Admitting: Family Medicine

## 2024-03-27 NOTE — Telephone Encounter (Signed)
 Pt LOV was on 08/12/23 Last refill done on 01/17/24 Please advise

## 2024-03-28 MED ORDER — AMPHETAMINE-DEXTROAMPHET ER 20 MG PO CP24: 20.0000 mg | ORAL_CAPSULE | ORAL | 0 refills | Status: DC

## 2024-03-28 NOTE — Telephone Encounter (Signed)
 Done

## 2024-05-10 ENCOUNTER — Encounter: Payer: Self-pay | Admitting: Family Medicine

## 2024-06-19 ENCOUNTER — Encounter: Payer: Self-pay | Admitting: Family Medicine

## 2024-06-20 NOTE — Telephone Encounter (Signed)
 She already has a refill that was available on 06-16-24

## 2024-08-17 ENCOUNTER — Encounter: Admitting: Family Medicine

## 2024-08-31 ENCOUNTER — Encounter: Admitting: Family Medicine

## 2024-09-03 ENCOUNTER — Encounter: Payer: Self-pay | Admitting: Family Medicine

## 2024-09-05 MED ORDER — AMPHETAMINE-DEXTROAMPHET ER 20 MG PO CP24: 20.0000 mg | ORAL_CAPSULE | ORAL | 0 refills | Status: DC

## 2024-09-05 NOTE — Telephone Encounter (Signed)
 I sent in a 30 day supply, but she will need an in person OV for any more after that

## 2024-09-07 ENCOUNTER — Telehealth: Admitting: Physician Assistant

## 2024-09-07 DIAGNOSIS — N898 Other specified noninflammatory disorders of vagina: Secondary | ICD-10-CM

## 2024-09-07 NOTE — Progress Notes (Signed)
  Because of having a new sexual partner and having vaginal irritation and discharge, I feel your condition warrants further evaluation and I recommend that you be seen in a face-to-face visit for formal testing.   NOTE: There will be NO CHARGE for this E-Visit   If you are having a true medical emergency, please call 911.     For an urgent face to face visit, [facility] has multiple urgent care centers for your convenience.  Click the link below for the full list of locations and hours, walk-in wait times, appointment scheduling options and driving directions:  Urgent Care - [place], [place], [place], [place], [place], [place]  [place]     Your MyChart E-visit questionnaire answers were reviewed by a board certified advanced clinical practitioner to complete your personal care plan based on your specific symptoms.    Thank you for using e-Visits.       I have spent 5 minutes in review of e-visit questionnaire, review and updating patient chart, medical decision making and response to patient.   Delon CHRISTELLA Dickinson, PA-C

## 2024-09-08 ENCOUNTER — Telehealth: Admitting: Nurse Practitioner

## 2024-09-08 DIAGNOSIS — R399 Unspecified symptoms and signs involving the genitourinary system: Secondary | ICD-10-CM

## 2024-09-08 MED ORDER — NITROFURANTOIN MONOHYD MACRO 100 MG PO CAPS: 100.0000 mg | ORAL_CAPSULE | Freq: Two times a day (BID) | ORAL | 0 refills | Status: AC

## 2024-09-08 NOTE — Progress Notes (Signed)
 I have spent 5 minutes in review of e-visit questionnaire, review and updating patient chart, medical decision making and response to patient.   Claiborne Rigg, NP

## 2024-09-08 NOTE — Progress Notes (Signed)

## 2024-09-21 ENCOUNTER — Ambulatory Visit (INDEPENDENT_AMBULATORY_CARE_PROVIDER_SITE_OTHER): Admitting: Family Medicine

## 2024-09-21 ENCOUNTER — Encounter: Payer: Self-pay | Admitting: Family Medicine

## 2024-09-21 VITALS — BP 120/82 | HR 75 | Temp 98.3°F | Ht 63.75 in | Wt 129.4 lb

## 2024-09-21 DIAGNOSIS — Z0000 Encounter for general adult medical examination without abnormal findings: Secondary | ICD-10-CM | POA: Diagnosis not present

## 2024-09-21 DIAGNOSIS — D509 Iron deficiency anemia, unspecified: Secondary | ICD-10-CM | POA: Insufficient documentation

## 2024-09-21 MED ORDER — FERROUS GLUCONATE 324 (38 FE) MG PO TABS: 648.0000 mg | ORAL_TABLET | Freq: Every day | ORAL | Status: AC

## 2024-09-21 NOTE — Progress Notes (Signed)
   Subjective:    Patient ID: Mckenzie Kelley, female    DOB: [birth year], 33 y.o.   MRN: [id number]  HPI Here for a well exam. She feels great. She had a GYN exam recently.    Review of Systems  Constitutional: Negative.   HENT: Negative.    Eyes: Negative.   Respiratory: Negative.    Cardiovascular: Negative.   Gastrointestinal: Negative.   Genitourinary:  Negative for decreased urine volume, difficulty urinating, dyspareunia, dysuria, enuresis, flank pain, frequency, hematuria, pelvic pain and urgency.  Musculoskeletal: Negative.   Skin: Negative.   Neurological: Negative.  Negative for headaches.  Psychiatric/Behavioral: Negative.         Objective:   Physical Exam Constitutional:      General: She is not in acute distress.    Appearance: Normal appearance. She is well-developed.  HENT:     Head: Normocephalic and atraumatic.     Right Ear: External ear normal.     Left Ear: External ear normal.     Nose: Nose normal.     Mouth/Throat:     Pharynx: No oropharyngeal exudate.  Eyes:     General: No scleral icterus.    Conjunctiva/sclera: Conjunctivae normal.     Pupils: Pupils are equal, round, and reactive to light.  Neck:     Thyroid : No thyromegaly.     Vascular: No JVD.  Cardiovascular:     Rate and Rhythm: Normal rate and regular rhythm.     Pulses: Normal pulses.     Heart sounds: Normal heart sounds. No murmur heard.    No friction rub. No gallop.  Pulmonary:     Effort: Pulmonary effort is normal. No respiratory distress.     Breath sounds: Normal breath sounds. No wheezing or rales.  Chest:     Chest wall: No tenderness.  Abdominal:     General: Bowel sounds are normal. There is no distension.     Palpations: Abdomen is soft. There is no mass.     Tenderness: There is no abdominal tenderness. There is no guarding or rebound.  Musculoskeletal:        General: No tenderness. Normal range of motion.     Cervical back: Normal range of motion and neck  supple.  Lymphadenopathy:     Cervical: No cervical adenopathy.  Skin:    General: Skin is warm and dry.     Findings: No erythema or rash.  Neurological:     General: No focal deficit present.     Mental Status: She is alert and oriented to person, place, and time.     Cranial Nerves: No cranial nerve deficit.     Motor: No abnormal muscle tone.     Coordination: Coordination normal.     Deep Tendon Reflexes: Reflexes are normal and symmetric. Reflexes normal.  Psychiatric:        Mood and Affect: Mood normal.        Behavior: Behavior normal.        Thought Content: Thought content normal.        Judgment: Judgment normal.           Assessment & Plan:  Well exam. We discussed diet and exercise. Get fasting labs. Garnette Olmsted, MD

## 2024-09-22 LAB — BASIC METABOLIC PANEL WITH GFR
BUN: 15 mg/dL (ref 7–25)
CO2: 27 mmol/L (ref 20–32)
Calcium: 9.9 mg/dL (ref 8.6–10.2)
Chloride: 101 mmol/L (ref 98–110)
Creat: 0.68 mg/dL (ref 0.50–0.97)
Glucose, Bld: 88 mg/dL (ref 65–99)
Potassium: 4.5 mmol/L (ref 3.5–5.3)
Sodium: 137 mmol/L (ref 135–146)
eGFR: 119 mL/min/1.73m2 (ref >=60)

## 2024-09-22 LAB — CBC WITH DIFFERENTIAL/PLATELET
Absolute Lymphocytes: 1708 {cells}/uL (ref 850–3900)
Absolute Monocytes: 431 {cells}/uL (ref 200–950)
Basophils Absolute: 29 {cells}/uL (ref 0–200)
Basophils Relative: 0.4 %
Eosinophils Absolute: 110 {cells}/uL (ref 15–500)
Eosinophils Relative: 1.5 %
HCT: 45.2 % — ABNORMAL HIGH (ref 35.0–45.0)
Hemoglobin: 14.8 g/dL (ref 11.7–15.5)
MCH: 29.8 pg (ref 27.0–33.0)
MCHC: 32.7 g/dL (ref 32.0–36.0)
MCV: 90.9 fL (ref 80.0–100.0)
MPV: 10.7 fL (ref 7.5–12.5)
Monocytes Relative: 5.9 %
Neutro Abs: 5022 {cells}/uL (ref 1500–7800)
Neutrophils Relative %: 68.8 %
Platelets: 363 Thousand/uL (ref 140–400)
RBC: 4.97 Million/uL (ref 3.80–5.10)
RDW: 13.3 % (ref 11.0–15.0)
Total Lymphocyte: 23.4 %
WBC: 7.3 Thousand/uL (ref 3.8–10.8)

## 2024-09-22 LAB — HEPATIC FUNCTION PANEL
AG Ratio: 1.6 (calc) (ref 1.0–2.5)
ALT: 9 U/L (ref 6–29)
AST: 18 U/L (ref 10–30)
Albumin: 4.8 g/dL (ref 3.6–5.1)
Alkaline phosphatase (APISO): 73 U/L (ref 31–125)
Bilirubin, Direct: 0.1 mg/dL (ref 0.0–0.2)
Globulin: 3 g/dL (ref 1.9–3.7)
Indirect Bilirubin: 0.5 mg/dL (ref 0.2–1.2)
Total Bilirubin: 0.6 mg/dL (ref 0.2–1.2)
Total Protein: 7.8 g/dL (ref 6.1–8.1)

## 2024-09-22 LAB — FERRITIN: Ferritin: 19 ng/mL (ref 16–154)

## 2024-09-22 LAB — LIPID PANEL
Cholesterol: 196 mg/dL (ref <200)
HDL: 95 mg/dL (ref >=50)
LDL Cholesterol (Calc): 87 mg/dL
Non-HDL Cholesterol (Calc): 101 mg/dL (ref <130)
Total CHOL/HDL Ratio: 2.1 (calc) (ref <5.0)
Triglycerides: 47 mg/dL (ref <150)

## 2024-09-22 LAB — HEMOGLOBIN A1C
Hgb A1c MFr Bld: 4.8 % (ref <5.7)
Mean Plasma Glucose: 91 mg/dL
eAG (mmol/L): 5 mmol/L

## 2024-09-22 LAB — TSH: TSH: 1.15 m[IU]/L

## 2024-09-22 LAB — IRON: Iron: 39 ug/dL — ABNORMAL LOW (ref 40–190)

## 2024-09-24 ENCOUNTER — Ambulatory Visit: Payer: Self-pay | Admitting: Family Medicine

## 2024-10-24 ENCOUNTER — Encounter: Payer: Self-pay | Admitting: Family Medicine

## 2024-10-26 MED ORDER — AMPHETAMINE-DEXTROAMPHET ER 20 MG PO CP24: 20.0000 mg | ORAL_CAPSULE | ORAL | 0 refills | Status: DC

## 2024-10-26 NOTE — Telephone Encounter (Signed)
 Done

## 2024-11-26 ENCOUNTER — Encounter: Payer: Self-pay | Admitting: Family Medicine

## 2024-11-27 NOTE — Telephone Encounter (Signed)
 She already has refills until Jan 30

## 2025-01-23 ENCOUNTER — Encounter: Payer: Self-pay | Admitting: Family Medicine

## 2025-01-23 ENCOUNTER — Other Ambulatory Visit: Payer: Self-pay | Admitting: Family Medicine

## 2025-01-24 MED ORDER — AMPHETAMINE-DEXTROAMPHET ER 20 MG PO CP24: 20.0000 mg | ORAL_CAPSULE | ORAL | 0 refills | Status: AC

## 2025-01-24 NOTE — Telephone Encounter (Signed)
 I refilled the Zoloft  yesterday and the Adderall today

## 2025-01-24 NOTE — Telephone Encounter (Signed)
 Pt needs refill on the adderall prescription zoloft  has already been sent to pt pharmacy
